# Patient Record
Sex: Male | Born: 1970 | Race: Black or African American | Hispanic: No | Marital: Single | Smoking: Current every day smoker
Health system: Southern US, Community
[De-identification: ages and names within clinical notes are randomized; demographics above are authoritative.]

## PROBLEM LIST (undated history)

## (undated) DIAGNOSIS — F419 Anxiety disorder, unspecified: Secondary | ICD-10-CM

## (undated) HISTORY — DX: Anxiety disorder, unspecified: F41.9

---

## 1998-06-21 ENCOUNTER — Emergency Department (HOSPITAL_COMMUNITY): Admission: EM | Admit: 1998-06-21 | Discharge: 1998-06-21 | Payer: Self-pay | Admitting: Emergency Medicine

## 2006-03-18 ENCOUNTER — Emergency Department (HOSPITAL_COMMUNITY): Admission: EM | Admit: 2006-03-18 | Discharge: 2006-03-18 | Payer: Self-pay | Admitting: Emergency Medicine

## 2006-03-20 ENCOUNTER — Emergency Department (HOSPITAL_COMMUNITY): Admission: EM | Admit: 2006-03-20 | Discharge: 2006-03-20 | Payer: Self-pay | Admitting: Family Medicine

## 2006-03-30 ENCOUNTER — Emergency Department (HOSPITAL_COMMUNITY): Admission: EM | Admit: 2006-03-30 | Discharge: 2006-03-30 | Payer: Self-pay | Admitting: Emergency Medicine

## 2008-12-28 ENCOUNTER — Emergency Department (HOSPITAL_COMMUNITY): Admission: EM | Admit: 2008-12-28 | Discharge: 2008-12-29 | Payer: Self-pay | Admitting: Emergency Medicine

## 2012-05-18 DIAGNOSIS — Z202 Contact with and (suspected) exposure to infections with a predominantly sexual mode of transmission: Secondary | ICD-10-CM | POA: Insufficient documentation

## 2012-05-18 DIAGNOSIS — R300 Dysuria: Secondary | ICD-10-CM | POA: Insufficient documentation

## 2012-05-18 DIAGNOSIS — F17200 Nicotine dependence, unspecified, uncomplicated: Secondary | ICD-10-CM | POA: Insufficient documentation

## 2012-05-19 ENCOUNTER — Encounter (HOSPITAL_COMMUNITY): Payer: Self-pay | Admitting: Emergency Medicine

## 2012-05-19 ENCOUNTER — Emergency Department (HOSPITAL_COMMUNITY)
Admission: EM | Admit: 2012-05-19 | Discharge: 2012-05-19 | Disposition: A | Discharge status: Home / Self Care | Payer: Self-pay | Attending: Emergency Medicine | Admitting: Emergency Medicine

## 2012-05-19 DIAGNOSIS — R300 Dysuria: Secondary | ICD-10-CM

## 2012-05-19 LAB — URINALYSIS, ROUTINE W REFLEX MICROSCOPIC
Bilirubin Urine: NEGATIVE
Leukocytes, UA: NEGATIVE
Nitrite: NEGATIVE
Specific Gravity, Urine: 1.04 — ABNORMAL HIGH (ref 1.005–1.030)
Urobilinogen, UA: 1 mg/dL (ref 0.0–1.0)
pH: 6 (ref 5.0–8.0)

## 2012-05-19 MED ORDER — LIDOCAINE HCL 1 % IJ SOLN
INTRAMUSCULAR | Status: AC
  Administered 2012-05-19: 2.1 mL
  Filled 2012-05-19: qty 20

## 2012-05-19 MED ORDER — AZITHROMYCIN 250 MG PO TABS
ORAL_TABLET | ORAL | Status: AC
  Administered 2012-05-19: 1000 mg via ORAL
  Filled 2012-05-19: qty 4

## 2012-05-19 MED ORDER — CEFTRIAXONE SODIUM 250 MG IJ SOLR
INTRAMUSCULAR | Status: AC
  Filled 2012-05-19: qty 250

## 2012-05-19 MED ORDER — AZITHROMYCIN 250 MG PO TABS
1000.0000 mg | ORAL_TABLET | Freq: Once | ORAL | Status: AC
  Administered 2012-05-19: 1000 mg via ORAL

## 2012-05-19 MED ORDER — CEFTRIAXONE SODIUM 250 MG IJ SOLR
250.0000 mg | Freq: Once | INTRAMUSCULAR | Status: AC
  Administered 2012-05-19: 250 mg via INTRAMUSCULAR

## 2012-05-19 NOTE — ED Notes (Signed)
Pt also request to be tested for STD

## 2012-05-19 NOTE — Discharge Instructions (Signed)

## 2012-05-19 NOTE — ED Notes (Signed)
NP at bedside.

## 2012-05-19 NOTE — ED Provider Notes (Signed)
Medical screening examination/treatment/procedure(s) were performed by non-physician practitioner and as supervising physician I was immediately available for consultation/collaboration. Devoria Albe, MD, FACEP   Ward Givens, MD 05/19/12 [phone]

## 2012-05-19 NOTE — ED Notes (Signed)
Pt alert, nad, c/o burning with urination, onset this am, resp even unlabored, skin pwd

## 2012-05-19 NOTE — ED Provider Notes (Signed)
History     CSN: [id number]  Arrival date & time 05/18/12  2343   First MD Initiated Contact with Patient 05/19/12 0225      Chief Complaint  Patient presents with  . Dysuria  . Exposure to STD    (Consider location/radiation/quality/duration/timing/severity/associated sxs/prior treatment) HPI Comments: The patient noticed a burning sensation with urination since early this afternoon.  He is working outside in the heat lately.  He denies sexual intercourse with anyone other than his wife he does give a history of an STD, in his early teens, and he, states this feels the same discussed testing, and treatment and he requests both.  He does not report any recent episodes of frequency of urination at night, starting or maintaining his urinary stream is no family history of prostate cancer  Patient is a 41 y.o. male presenting with dysuria and STD exposure. The history is provided by the patient.  Dysuria  This is a new problem. The current episode started 3 to 5 hours ago. The problem occurs every urination. The problem has not changed since onset.The quality of the pain is described as burning. The pain is at a severity of 0/10. The patient is experiencing no pain. There has been no fever. Pertinent negatives include no chills, no nausea, no discharge, no frequency and no urgency.  Exposure to STD Pertinent negatives include no chills, fever or nausea.    History reviewed. No pertinent past medical history.  History reviewed. No pertinent past surgical history.  No family history on file.  History  Substance Use Topics  . Smoking status: Current Everyday Smoker -- 1.0 packs/day    Types: Cigarettes  . Smokeless tobacco: Not on file  . Alcohol Use: No      Review of Systems  Constitutional: Negative for fever and chills.  Gastrointestinal: Negative for nausea.  Genitourinary: Positive for dysuria. Negative for urgency, frequency, discharge, penile swelling, penile pain and  testicular pain.  Musculoskeletal: Negative for back pain.    Allergies  Review of patient's allergies indicates no known allergies.  Home Medications  No current outpatient prescriptions on file.  BP 145/81  Pulse 91  Temp 98 F (36.7 C)  Resp 16  Wt 160 lb (72.576 kg)  SpO2 97%  Physical Exam  Constitutional: He appears well-developed.  HENT:  Head: Normocephalic.  Eyes: Pupils are equal, round, and reactive to light.  Neck: Normal range of motion.  Cardiovascular: Normal rate.   Pulmonary/Chest: Effort normal.  Genitourinary: Penis normal. No penile tenderness.       Patient is circumcised.  There is no irritation at the urinary meatus.  There is no discharge there is no tenderness to shaft of the penis normal testicles, there is no rash or lesions  Musculoskeletal: Normal range of motion.  Neurological: He is alert.  Skin: Skin is warm.    ED Course  Procedures (including critical care time)  Labs Reviewed  URINALYSIS, ROUTINE W REFLEX MICROSCOPIC - Abnormal; Notable for the following:    Color, Urine AMBER (*) BIOCHEMICALS MAY BE AFFECTED BY COLOR   APPearance CLOUDY (*)    Specific Gravity, Urine 1.040 (*)    Ketones, ur TRACE (*)    All other components within normal limits  GC/CHLAMYDIA PROBE AMP, GENITAL   No results found.   1. Dysuria       MDM   Patient's exam is normal, but per his request.  He has been treated for possible STD cultures have been  sent        Arman Filter, NP 05/19/12 [id number]

## 2012-05-21 LAB — GC/CHLAMYDIA PROBE AMP, GENITAL
Chlamydia, DNA Probe: NEGATIVE
GC Probe Amp, Genital: NEGATIVE

## 2012-10-16 ENCOUNTER — Emergency Department (HOSPITAL_COMMUNITY)
Admission: EM | Admit: 2012-10-16 | Discharge: 2012-10-16 | Disposition: A | Discharge status: Home / Self Care | Payer: Self-pay | Attending: Emergency Medicine | Admitting: Emergency Medicine

## 2012-10-16 ENCOUNTER — Encounter (HOSPITAL_COMMUNITY): Payer: Self-pay | Admitting: *Deleted

## 2012-10-16 DIAGNOSIS — Y9389 Activity, other specified: Secondary | ICD-10-CM | POA: Insufficient documentation

## 2012-10-16 DIAGNOSIS — IMO0002 Reserved for concepts with insufficient information to code with codable children: Secondary | ICD-10-CM

## 2012-10-16 DIAGNOSIS — S335XXA Sprain of ligaments of lumbar spine, initial encounter: Secondary | ICD-10-CM | POA: Insufficient documentation

## 2012-10-16 DIAGNOSIS — Y9269 Other specified industrial and construction area as the place of occurrence of the external cause: Secondary | ICD-10-CM | POA: Insufficient documentation

## 2012-10-16 DIAGNOSIS — F17200 Nicotine dependence, unspecified, uncomplicated: Secondary | ICD-10-CM | POA: Insufficient documentation

## 2012-10-16 DIAGNOSIS — Y990 Civilian activity done for income or pay: Secondary | ICD-10-CM | POA: Insufficient documentation

## 2012-10-16 DIAGNOSIS — X500XXA Overexertion from strenuous movement or load, initial encounter: Secondary | ICD-10-CM | POA: Insufficient documentation

## 2012-10-16 MED ORDER — IBUPROFEN 600 MG PO TABS: 600.0000 mg | ORAL_TABLET | Freq: Four times a day (QID) | ORAL | Status: DC | PRN

## 2012-10-16 MED ORDER — KETOROLAC TROMETHAMINE 60 MG/2ML IM SOLN
60.0000 mg | Freq: Once | INTRAMUSCULAR | Status: AC
  Administered 2012-10-16: 60 mg via INTRAMUSCULAR
  Filled 2012-10-16: qty 2

## 2012-10-16 MED ORDER — HYDROCODONE-ACETAMINOPHEN 5-500 MG PO TABS: 1.0000 | ORAL_TABLET | Freq: Four times a day (QID) | ORAL | Status: DC | PRN

## 2012-10-16 MED ORDER — CYCLOBENZAPRINE HCL 10 MG PO TABS: 10.0000 mg | ORAL_TABLET | Freq: Two times a day (BID) | ORAL | Status: DC | PRN

## 2012-10-16 MED ORDER — OXYCODONE-ACETAMINOPHEN 5-325 MG PO TABS
1.0000 | ORAL_TABLET | Freq: Once | ORAL | Status: AC
  Administered 2012-10-16: 1 via ORAL
  Filled 2012-10-16: qty 1

## 2012-10-16 NOTE — ED Provider Notes (Signed)
History     CSN: [id number]  Arrival date & time 10/16/12  1152   First MD Initiated Contact with Patient 10/16/12 1312      No chief complaint on file.   (Consider location/radiation/quality/duration/timing/severity/associated sxs/prior treatment) HPI Comments: This is a 41 year old male, who presents to the emergency department with chief complaint of low back pain. The patient states that he was at work last night, and lifted a chair, when he felt a sharp pain in his back. The pain does not radiate to his legs. Denies decreased sensation. He has not taken anything for his pain.  The history is provided by the patient. No language interpreter was used.    History reviewed. No pertinent past medical history.  History reviewed. No pertinent past surgical history.  History reviewed. No pertinent family history.  History  Substance Use Topics  . Smoking status: Current Every Day Smoker -- 1.0 packs/day    Types: Cigarettes  . Smokeless tobacco: Never Used  . Alcohol Use: No      Review of Systems  Musculoskeletal: Positive for back pain.       Lumbar back pain  All other systems reviewed and are negative.    Allergies  Review of patient's allergies indicates no known allergies.  Home Medications  No current outpatient prescriptions on file.  BP 126/73  Pulse 77  Temp 99.5 F (37.5 C) (Oral)  Resp 18  SpO2 99%  Physical Exam  Nursing note and vitals reviewed. Constitutional: He is oriented to person, place, and time. He appears well-developed and well-nourished.  HENT:  Head: Normocephalic and atraumatic.  Eyes: Conjunctivae normal and EOM are normal. Pupils are equal, round, and reactive to light. Right eye exhibits no discharge. Left eye exhibits no discharge. No scleral icterus.  Neck: Normal range of motion. Neck supple.  Cardiovascular: Normal rate, regular rhythm and normal heart sounds.  Exam reveals no gallop and no friction rub.   No murmur  heard. Pulmonary/Chest: Breath sounds normal. No respiratory distress. He has no wheezes. He has no rales. He exhibits no tenderness.  Abdominal: Soft. Bowel sounds are normal.  Musculoskeletal: Normal range of motion.       Tenderness to palpation in lumbar paraspinals, no bony tenderness, or vertebral disc space tenderness, sensation and strength intact bilaterally  Neurological: He is alert and oriented to person, place, and time.  Skin: Skin is warm and dry.  Psychiatric: He has a normal mood and affect. His behavior is normal. Judgment and thought content normal.    ED Course  Procedures (including critical care time)  Labs Reviewed - No data to display No results found.   1. Back sprain or strain       MDM  41 year old male with back sprain/strain. Will manage the patient's pain while in the ED.  Patient is neurovascularly intact. I will have the patient follow up with PCP. They are stable and ready for discharge.   2:30 PM Patient reports that his pain is significantly better, he is able to stand and walk with mild discomfort. I'm going to discharge the patient to home with primary care follow up.  Treatments while in ED: 1. Vicodin 5-325 one tab 2. Toradol 60 mg IM  Discharged with: 1. Vicodin  2. Ibuprofen 3 Flexeril        Roxy Horseman, PA-C 10/16/12 1431

## 2012-10-16 NOTE — ED Notes (Signed)
Pt states yesterday was cleaning chairs and developed 2 sharp pains in his lower back and states "I went to the floor". Denies picking chairs up, states he was just cleaning chairs.

## 2012-10-17 NOTE — ED Provider Notes (Signed)
Medical screening examination/treatment/procedure(s) were performed by non-physician practitioner and as supervising physician I was immediately available for consultation/collaboration.   Benny Lennert, MD 10/17/12 [phone]

## 2015-01-01 ENCOUNTER — Emergency Department (HOSPITAL_COMMUNITY)
Admission: EM | Admit: 2015-01-01 | Discharge: 2015-01-01 | Disposition: A | Discharge status: Home / Self Care | Payer: Self-pay | Attending: Emergency Medicine | Admitting: Emergency Medicine

## 2015-01-01 ENCOUNTER — Emergency Department (HOSPITAL_COMMUNITY): Payer: Self-pay

## 2015-01-01 ENCOUNTER — Encounter (HOSPITAL_COMMUNITY): Payer: Self-pay | Admitting: *Deleted

## 2015-01-01 DIAGNOSIS — R0789 Other chest pain: Secondary | ICD-10-CM | POA: Insufficient documentation

## 2015-01-01 DIAGNOSIS — F17200 Nicotine dependence, unspecified, uncomplicated: Secondary | ICD-10-CM

## 2015-01-01 DIAGNOSIS — M79622 Pain in left upper arm: Secondary | ICD-10-CM | POA: Insufficient documentation

## 2015-01-01 DIAGNOSIS — Z720 Tobacco use: Secondary | ICD-10-CM | POA: Insufficient documentation

## 2015-01-01 DIAGNOSIS — R079 Chest pain, unspecified: Secondary | ICD-10-CM

## 2015-01-01 LAB — I-STAT TROPONIN, ED
TROPONIN I, POC: 0 ng/mL (ref 0.00–0.08)
Troponin i, poc: 0 ng/mL (ref 0.00–0.08)

## 2015-01-01 LAB — CBC
HEMATOCRIT: 45.3 % (ref 39.0–52.0)
Hemoglobin: 15.5 g/dL (ref 13.0–17.0)
MCH: 29.1 pg (ref 26.0–34.0)
MCHC: 34.2 g/dL (ref 30.0–36.0)
MCV: 85 fL (ref 78.0–100.0)
PLATELETS: 220 10*3/uL (ref 150–400)
RBC: 5.33 MIL/uL (ref 4.22–5.81)
RDW: 13.8 % (ref 11.5–15.5)
WBC: 6.2 10*3/uL (ref 4.0–10.5)

## 2015-01-01 LAB — BASIC METABOLIC PANEL
Anion gap: 5 (ref 5–15)
BUN: 15 mg/dL (ref 6–23)
CO2: 31 mmol/L (ref 19–32)
Calcium: 9.5 mg/dL (ref 8.4–10.5)
Chloride: 104 mEq/L (ref 96–112)
Creatinine, Ser: 1.11 mg/dL (ref 0.50–1.35)
GFR calc Af Amer: >90 mL/min (ref >90)
GFR calc non Af Amer: 80 mL/min — ABNORMAL LOW (ref >90)
Glucose, Bld: 95 mg/dL (ref 70–99)
POTASSIUM: 3.9 mmol/L (ref 3.5–5.1)
Sodium: 140 mmol/L (ref 135–145)

## 2015-01-01 MED ORDER — AMOXICILLIN 500 MG PO CAPS
500.0000 mg | ORAL_CAPSULE | Freq: Once | ORAL | Status: AC
  Administered 2015-01-01: 500 mg via ORAL
  Filled 2015-01-01: qty 1

## 2015-01-01 MED ORDER — AMOXICILLIN 500 MG PO CAPS: 500.0000 mg | ORAL_CAPSULE | Freq: Three times a day (TID) | ORAL | Status: DC

## 2015-01-01 NOTE — ED Provider Notes (Signed)
CSN: [id number]     Arrival date & time 01/01/15  1627 History   First MD Initiated Contact with Patient 01/01/15 2055     Chief Complaint  Patient presents with  . Chest Pain     (Consider location/radiation/quality/duration/timing/severity/associated sxs/prior Treatment) HPI     Bryan Rivas is a 44 y.o. male c/o sharp, focal, left axillary pain rated at 2 out of 3 intermittently for the last 4 days. Patient states it lasts approximately 5 minutes, this comes on approximately 2 times per day. No pain medication taken prior to arrival.  Pain has been non-exertional, non-pleuritic or positional, not exacerbated by palpation. Denies SOB, N/V, diaphoresis, cough, fever, back pain, syncope, prior episodes, recent cocaine/methamphetimine use. Denies h/o DVT, PE,  recent travel, leg swelling, hemoptysis.  Pt has not received any ASA or NTG in the last 24 hours. Patient states he has a tooth that is status post root canal and tastes a foul taste in his mouth for the last several days he denies pain, swelling, discharge from the tooth.  RF: Tobacco use 20 pack year, father MI @ 40 Last Stress test: ? Cardiologost:? PCP:?   History reviewed. No pertinent past medical history. History reviewed. No pertinent past surgical history. History reviewed. No pertinent family history. History  Substance Use Topics  . Smoking status: Current Every Day Smoker -- 1.00 packs/day    Types: Cigarettes  . Smokeless tobacco: Never Used  . Alcohol Use: No    Review of Systems  10 systems reviewed and found to be negative, except as noted in the HPI.   Allergies  Review of patient's allergies indicates no known allergies.  Home Medications   Prior to Admission medications   Medication Sig Start Date End Date Taking? Authorizing Provider  amoxicillin (AMOXIL) 500 MG capsule Take 1 capsule (500 mg total) by mouth 3 (three) times daily. 01/01/15   Bryan Seres, PA-C  cyclobenzaprine (FLEXERIL)  10 MG tablet Take 1 tablet (10 mg total) by mouth 2 (two) times daily as needed for muscle spasms. 10/16/12   Bryan Horseman, PA-C  HYDROcodone-acetaminophen (VICODIN) 5-500 MG per tablet Take 1-2 tablets by mouth every 6 (six) hours as needed for pain. 10/16/12   Bryan Horseman, PA-C  ibuprofen (ADVIL,MOTRIN) 600 MG tablet Take 1 tablet (600 mg total) by mouth every 6 (six) hours as needed for pain. 10/16/12   Bryan Horseman, PA-C   BP 129/81 mmHg  Pulse 52  Temp(Src) 97.5 F (36.4 C) (Oral)  Resp 14  SpO2 100% Physical Exam  Constitutional: He is oriented to person, place, and time. He appears well-developed and well-nourished. No distress.  HENT:  Head: Normocephalic and atraumatic.  Mouth/Throat: Oropharynx is clear and moist.  Generally good dentition, no gingival swelling, erythema or tenderness to palpation. Patient is handling their secretions. There is no tenderness to palpation or firmness underneath tongue bilaterally. No trismus.    Eyes: Conjunctivae and EOM are normal. Pupils are equal, round, and reactive to light.  Neck: Normal range of motion. Neck supple.  Cardiovascular: Normal rate, regular rhythm and intact distal pulses.   Pulmonary/Chest: Effort normal and breath sounds normal. No stridor. No respiratory distress. He has no wheezes. He has no rales. He exhibits no tenderness.  Abdominal: Soft. Bowel sounds are normal. He exhibits no distension and no mass. There is no tenderness. There is no rebound and no guarding.  Musculoskeletal: Normal range of motion. He exhibits no edema or tenderness.  No calf asymmetry,  superficial collaterals, palpable cords, edema, Homans sign negative bilaterally.    Neurological: He is alert and oriented to person, place, and time.  Skin: Skin is warm.  Psychiatric: He has a normal mood and affect.  Nursing note and vitals reviewed.   ED Course  Procedures (including critical care time) Labs Review Labs Reviewed  BASIC  METABOLIC PANEL - Abnormal; Notable for the following:    GFR calc non Af Amer 80 (*)    All other components within normal limits  CBC  I-STAT TROPOININ, ED  Rosezena SensorI-STAT TROPOININ, ED    Imaging Review Dg Chest 2 View  01/01/2015   CLINICAL DATA:  Left-sided chest pain.  Pain for 2 days.  EXAM: CHEST  2 VIEW  COMPARISON:  None.  FINDINGS: Increased perihilar markings, borderline hyperinflation. The cardiomediastinal contours are normal. Pulmonary vasculature is normal. No consolidation, pleural effusion, or pneumothorax. No acute osseous abnormalities are seen.  IMPRESSION: Increased perihilar markings, suggests bronchitis or asthma.   Electronically Signed   By: Bryan OaksMelanie  Rivas M.D.   On: 01/01/2015 18:18     EKG Interpretation   Date/Time:  Thursday January 01 2015 16:32:51 EST Ventricular Rate:  79 PR Interval:  138 QRS Duration: 82 QT Interval:  348 QTC Calculation: 399 R Axis:   79 Text Interpretation:  Normal sinus rhythm with sinus arrhythmia Minimal  voltage criteria for LVH, may be normal variant Borderline ECG No old  tracing to compare Confirmed by Bryan Rivas, Bryan Rivas [phone]([id number]) on 01/01/2015  10:14:02 PM      MDM   Final diagnoses:  Chest pain, atypical  Tobacco use disorder    Filed Vitals:   01/01/15 2200 01/01/15 2215 01/01/15 2230 01/01/15 2244  BP:    129/81  Pulse:      Temp:      TempSrc:      Resp:      SpO2: 95% 99% 100%    Bryan Rivas is a pleasant 44 y.o. male presenting with focal intermittent left axillary pain onset 4 days ago. EKG is nonischemic, no arrhythmia. Blood work with no abnormality, initial and delta troponin are negative. Patient is low risk (1 for RF) by heart score. This is very atypical, I doubt this is cardiac in nature. Discussed case with attending who agrees with care plan and stability to discharge to home.  Patient states he has a foul taste in his mouth and thinks it is coming from his tooth that has remote root canal. No  abnormalities on my physical exam. Will start him on amoxicillin.   Evaluation does not show pathology that would require ongoing emergent intervention or inpatient treatment. Pt is hemodynamically stable and mentating appropriately. Discussed findings and plan with patient/guardian, who agrees with care plan. All questions answered. Return precautions discussed and outpatient follow up given.   Discharge Medication List as of 01/01/2015 10:32 PM    START taking these medications   Details  amoxicillin (AMOXIL) 500 MG capsule Take 1 capsule (500 mg total) by mouth 3 (three) times daily., Starting 01/01/2015, Until Discontinued, [employer]             Jajaira Ruis, PA-C 01/02/15 [id number]  Bryan MoMatthew Gentry, MD 01/02/15 [phone]

## 2015-01-01 NOTE — ED Notes (Signed)
Pt in c/o intermittent left sided chest pain, denies other symptoms, states pain does not last long when it hits, no distress noted, denies pain at this time.

## 2015-01-01 NOTE — Discharge Instructions (Signed)
Do not hesitate to return to the emergency room for any new, worsening or concerning symptoms.  Please obtain primary care using resource guide below. But the minute you were seen in the emergency room and that they will need to obtain records for further outpatient management.  Take your antibiotics as directed and to the end of the course.  Followup with a dentist is very important for ongoing evaluation and management of recurrent dental pain. Return to emergency department for emergent changing or worsening symptoms."  Low-cost dental clinic: Yancey Flemings  at [phone]**  **Nuala Alpha at [phone] [address]**    You may also call [phone]  Dental Assistance If the dentist on-call cannot see you, please use the resources below:   Patients with Medicaid: [facility] [phone] [address], [phone] [address] [phone]  If unable to pay, or uninsured, contact [facility] [phone]) or [facility] Department [phone] in [place], [phone] in [place]) to become qualified for the adult dental clinic  Other Low-Cost Community Dental Services: [facility] [address] Natasha Bence [place], [place], [address]    [phone], Ext. [phone]    2nd and 4th Thursday of the month at 6:30am    10 clients each day by appointment, can sometimes see walk-in     patients if someone does not show for an appointment [facility]- [address] Ether Griffins [place], [place], [address]    [phone] [facility] [address] [place], [place], [address]    [phone]  [facility] Health Department- [phone] [facility] Health Department- [phone] [facility] [phone]    Chest Pain (Nonspecific) It is often hard to give a specific diagnosis for the cause of chest pain. There is always a chance that your pain could be related to something serious, such as a heart attack or a blood  clot in the lungs. You need to follow up with your health care provider for further evaluation. CAUSES   Heartburn.  Pneumonia or bronchitis.  Anxiety or stress.  Inflammation around your heart (pericarditis) or lung (pleuritis or pleurisy).  A blood clot in the lung.  A collapsed lung (pneumothorax). It can develop suddenly on its own (spontaneous pneumothorax) or from trauma to the chest.  Shingles infection (herpes zoster virus). The chest wall is composed of bones, muscles, and cartilage. Any of these can be the source of the pain.  The bones can be bruised by injury.  The muscles or cartilage can be strained by coughing or overwork.  The cartilage can be affected by inflammation and become sore (costochondritis). DIAGNOSIS  Lab tests or other studies may be needed to find the cause of your pain. Your health care provider may have you take a test called an ambulatory electrocardiogram (ECG). An ECG records your heartbeat patterns over a 24-hour period. You may also have other tests, such as:  Transthoracic echocardiogram (TTE). During echocardiography, sound waves are used to evaluate how blood flows through your heart.  Transesophageal echocardiogram (TEE).  Cardiac monitoring. This allows your health care provider to monitor your heart rate and rhythm in real time.  Holter monitor. This is a portable device that records your heartbeat and can help diagnose heart arrhythmias. It allows your health care provider to track your heart activity for several days, if needed.  Stress tests by exercise or by giving medicine that makes the heart beat faster. TREATMENT   Treatment depends on what  may be causing your chest pain. Treatment may include:  Acid blockers for heartburn.  Anti-inflammatory medicine.  Pain medicine for inflammatory conditions.  Antibiotics if an infection is present.  You may be advised to change lifestyle habits. This includes stopping smoking and  avoiding alcohol, caffeine, and chocolate.  You may be advised to keep your head raised (elevated) when sleeping. This reduces the chance of acid going backward from your stomach into your esophagus. Most of the time, nonspecific chest pain will improve within 2-3 days with rest and mild pain medicine.  HOME CARE INSTRUCTIONS   If antibiotics were prescribed, take them as directed. Finish them even if you start to feel better.  For the next few days, avoid physical activities that bring on chest pain. Continue physical activities as directed.  Do not use any tobacco products, including cigarettes, chewing tobacco, or electronic cigarettes.  Avoid drinking alcohol.  Only take medicine as directed by your health care provider.  Follow your health care provider's suggestions for further testing if your chest pain does not go away.  Keep any follow-up appointments you made. If you do not go to an appointment, you could develop lasting (chronic) problems with pain. If there is any problem keeping an appointment, call to reschedule. SEEK MEDICAL CARE IF:   Your chest pain does not go away, even after treatment.  You have a rash with blisters on your chest.  You have a fever. SEEK IMMEDIATE MEDICAL CARE IF:   You have increased chest pain or pain that spreads to your arm, neck, jaw, back, or abdomen.  You have shortness of breath.  You have an increasing cough, or you cough up blood.  You have severe back or abdominal pain.  You feel nauseous or vomit.  You have severe weakness.  You faint.  You have chills. This is an emergency. Do not wait to see if the pain will go away. Get medical help at once. Call your local emergency services (911 in U.S.). Do not drive yourself to the hospital. MAKE SURE YOU:   Understand these instructions.  Will watch your condition.  Will get help right away if you are not doing well or get worse. Document Released: 09/14/2005 Document Revised:  12/10/2013 Document Reviewed: 07/10/2008 [facility] Patient Information 2015 [place], [place]. This information is not intended to replace advice given to you by your health care provider. Make sure you discuss any questions you have with your health care provider.     Emergency Department Resource Guide 1) Find a Doctor and Pay Out of Pocket Although you won't have to find out who is covered by your insurance plan, it is a good idea to ask around and get recommendations. You will then need to call the office and see if the doctor you have chosen will accept you as a new patient and what types of options they offer for patients who are self-pay. Some doctors offer discounts or will set up payment plans for their patients who do not have insurance, but you will need to ask so you aren't surprised when you get to your appointment.  2) Contact Your Local Health Department Not all health departments have doctors that can see patients for sick visits, but many do, so it is worth a call to see if yours does. If you don't know where your local health department is, you can check in your phone book. The CDC also has a tool to help you locate your state's health department,  and many state websites also have listings of all of their local health departments.  3) Find a Walk-in Clinic If your illness is not likely to be very severe or complicated, you may want to try a walk in clinic. These are popping up all over the country in pharmacies, drugstores, and shopping centers. They're usually staffed by nurse practitioners or physician assistants that have been trained to treat common illnesses and complaints. They're usually fairly quick and inexpensive. However, if you have serious medical issues or chronic medical problems, these are probably not your best option.  No Primary Care Doctor: - Call [facility] Connect at  [phone] - they can help you locate a primary care doctor that  accepts your insurance, provides  certain services, etc. - Physician Referral Service- [phone]  Chronic Pain Problems: Organization         Address  Phone   Notes  [facility]  [phone] Patients need to be referred by their primary care doctor.   Medication Assistance: Organization         Address  Phone   Notes  [facility] [address] [place] [place], [place] [address] [phone] --Must be a resident of [facility] -- Must have NO insurance coverage whatsoever (no Medicaid/ Medicare, etc.) -- The pt. MUST have a primary care doctor that directs their care regularly and follows them in the community   [facility]  [phone]   [employer]  [phone]    Agencies that provide inexpensive medical care: Organization         Address  Phone   Notes  [facility]  [phone]   [facility]    [phone]   [facility] [address] [place], [place] [address] [phone]   [facility] [place] [address] [place] [place] [phone]   Planned Parenthood    [phone]   [facility]    [phone]   [facility]  [address], [place] Phone:  [phone], Fax:  [phone] Hours of Operation:  9 am - 6 pm, M-F.  Also accepts Medicaid/Medicare and self-pay.  [facility]  [address], [place] Phone: [phone], Fax: [phone]. Hours of Operation:  8:30 am - 5:30 pm, M-F.  Also accepts Medicaid and self-pay.  [facility] [place] [address] [place] Phone: [phone]   [facility] [address] Natasha Bence [place], [place] [phone], Ext. [phone] Mondays & Thursdays: 7-9 AM.  First 15 patients are seen on a first come, first serve basis.    Medicaid-accepting [facility] Providers:  Organization         Address  Phone   Notes  [facility] [address], [place] [phone] Also accepts self-pay patients.  [facility] [address] Laurell Josephs [place], [place]  [phone]   [facility] [address], [place] [phone]   [facility] [address], [place] [phone]   Renaye Rakers [address], [place]   [phone] Only accepts [place] Access [place] patients after they have their name applied to their card.   Self-Pay (no insurance) in [facility]:  Organization  Address  Phone   Notes  Sickle Cell Patients, [facility] [address] [place], [place] [phone]   [facility] [address] [place], [place] [phone]   [facility] [place]  [address], [place] [phone]   [facility]/Dr. Osei-Bonsu  [address], [place] or [address], [place] [phone] Phone number for both [place] and [place] locations is the same.  Urgent Medical and [facility] [address], [place] [phone]   [facility] [address] [place] or [address] [phone]   [facility] [address], [place] [phone], phone; [phone], fax Sees patients 1st and 3rd Saturday of every month.  Must not qualify for public or private insurance (i.e. Medicaid, Medicare, [place] Health Choice, Veterans' Benefits)  Household income should be no more than 200% of the poverty level The clinic cannot treat you if you are pregnant or think you are pregnant  Sexually transmitted diseases are not treated at the clinic.    Dental Care: Organization         Address  Phone  Notes  [facility] [address] [place], [place] [phone]  Accepts children up to age 25 who are enrolled in [place] or [place] Health Choice; pregnant women with a Medicaid card; and children who have applied for Medicaid or [place] Health Choice, but were declined, whose parents can pay a reduced fee at time of service.  [facility] of [facility]  [address], [place] [phone] Accepts children up to age 20 who are enrolled in [place] or [place] Health Choice; pregnant women with a Medicaid card; and children who have applied for Medicaid or [place] Health Choice, but were declined, whose parents can pay a reduced fee at time of service.  [facility]  [address] [place], [place] [phone] Patients are seen by appointment only. Walk-ins are not accepted. [facility] will see patients 56 years of age and older. Monday - Tuesday (8am-5pm) Most Wednesdays (8:30-5pm) $30 per visit, cash only  [facility]  [address], [facility] [phone] Patients are seen by appointment only. Walk-ins are not accepted. [facility] will see patients 59 years of age and older. One Wednesday Evening (Monthly: Volunteer Based).  $30 per visit, cash only  [employer]  [phone] for adults; Children under age 40, call Graduate Pediatric Dentistry at [phone]. Children aged 4-14, please call [phone] to request a pediatric application.  Dental services are provided in all areas of dental care including fillings, crowns and bridges, complete and partial dentures, implants, gum treatment, root canals, and extractions. Preventive care is also provided. Treatment is provided to both adults and children. Patients are selected via a lottery and there is often a waiting list.   [facility] [address] [place]  [phone] [email or web address]   [facility] [address] [place], [place] [phone], Ext. [phone] Second  and Fourth Thursday of each month, opens at 6:30 AM; Clinic ends at 9 AM.  Patients are seen on a first-come first-served basis, and a limited number are seen during each clinic.   [facility]  [address] [place], [place]  [place], [place] [phone]   Eligibility Requirements You must have lived in [place], [place], or [place] counties for at least the last three months.   You cannot be eligible for state or federal sponsored [place], including [employer], [place], or [employer].   You generally cannot be eligible for healthcare insurance through your employer.    How to apply: Eligibility screenings are held every Tuesday and Wednesday afternoon from 1:00 pm until 4:00 pm. You do not need an appointment for the interview!  [facility] [address] [place], [place] [phone]   [facility] Health Department  [phone]   [facility] Health Department  [phone]   [facility] Health Department  [phone]    Behavioral Health Resources in the Community: Intensive Outpatient Programs Organization         Address  Phone  Notes  [facility] [address] [place], [place] [phone]   [facility] [address] [place], [place] [phone]   [facility] [address] [place], [place]  [phone]   [facility] Mental Health [address],  [place], [place] [phone] or [phone]   Substance Abuse Resources Organization         Address  Phone  Notes  [facility]  [phone]   [facility]  [phone]   [facility] [place]  [phone]   Floydene Flock  [phone]   Residential & Outpatient Substance Abuse Program  [phone]   Psychological Services Organization         Address  Phone  Notes  [facility] Behavioral Health  [phone]   [facility] Services  [phone]   [facility] Mental  Health [address], [place] [phone] or [phone]    Mobile Crisis Teams Organization         Address  Phone  Notes  [facility], Mobile Crisis Care Unit  [phone]   [facility]  [address] [place], [place] [phone]   Doristine Locks [address] [place] [phone]    Self-Help/Support Groups Organization         Address  Phone             Notes  [facility] [place] - variety of support groups  [phone]- [phone] Call for more information  Narcotics Anonymous (NA), [facility] [address], [employer] [place]  2 meetings at this location   Statistician         Address  Phone  Notes  [facility] [address],    [place]  [phone]   [facility]  [address] [place] [address], [place], [place] [phone]   [facility] [address] [place], [place] [phone] Admissions: 8am-3pm M-F  [facility] [address],    [place], [place] [phone]   [facility] [address] Starling Manns [place], [place] [phone]   [facility] [address],  [place], [place] [phone]   [facility] - Intensive Outpatient [address] Laurell Josephs [address], [place], [place] [phone]   [facility] (Addiction Recovery [facility] [address] [place].,  [place], [place] [phone] or [phone]   [facility] ([facility]) [address] [place], [place] [phone] Accepts Medicaid  [facility] [place] [address],  [place] [phone] Substance Abuse/Addiction Treatment   [facility] Resources Organization  Address  Phone  Notes  [facility]  [phone]) [phone]   Angie FavaJulie Brannon, PhD [address] [place], [place]   [phone]) [phone] or [phone]) [phone]   [facility]   [address] [place], [place]  [phone]) [phone]   [facility] [address], [place], [place] [phone]) [phone] Insurance/Medicaid/sponsorship through [facility]  [facility] [address]                                    [place], [place] [phone] Therapy/tele-psych/case  [facility] [address]   [place], [place] [phone](336) [phone]    Dr. Lolly MustacheArfeen  [phone](336) [phone]   [facility] [place]  [employer] [facility] 1) [address],  2) [address], [place] 3)  [address] [place] [address], [place] [phone]) [phone]) [phone]  [phone]) [phone]   [facility] Child Abuse Hotline [phone](336) [phone] or [phone](336) [phone] (After Hours)

## 2015-12-12 IMAGING — CR DG CHEST 2V
2 series · 2 of 2 positions shown · non-contrast
Comparison: None.

CLINICAL DATA: Left-sided chest pain.  Pain for 2 days.

EXAM:
CHEST  2 VIEW

[chest pa]
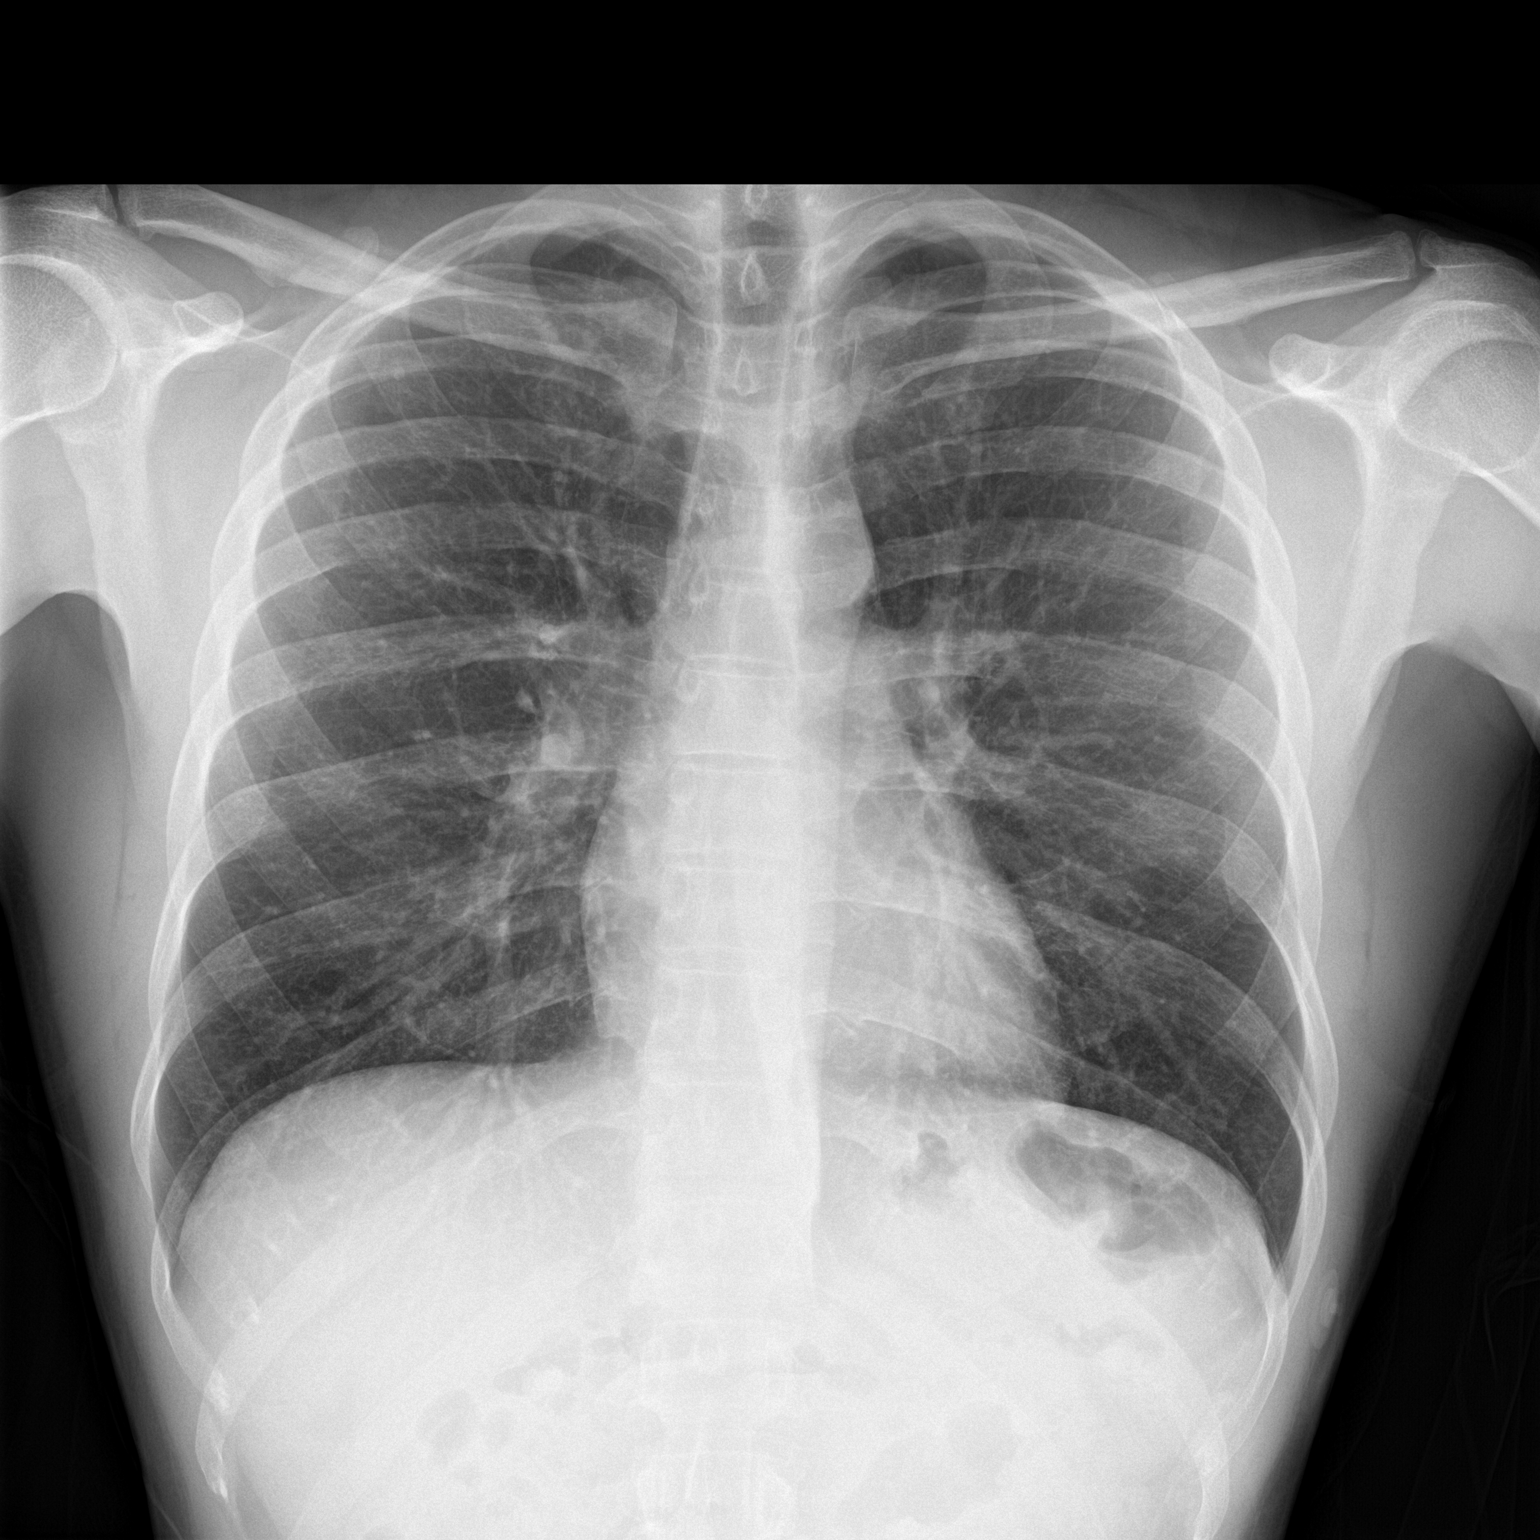

[chest lat]
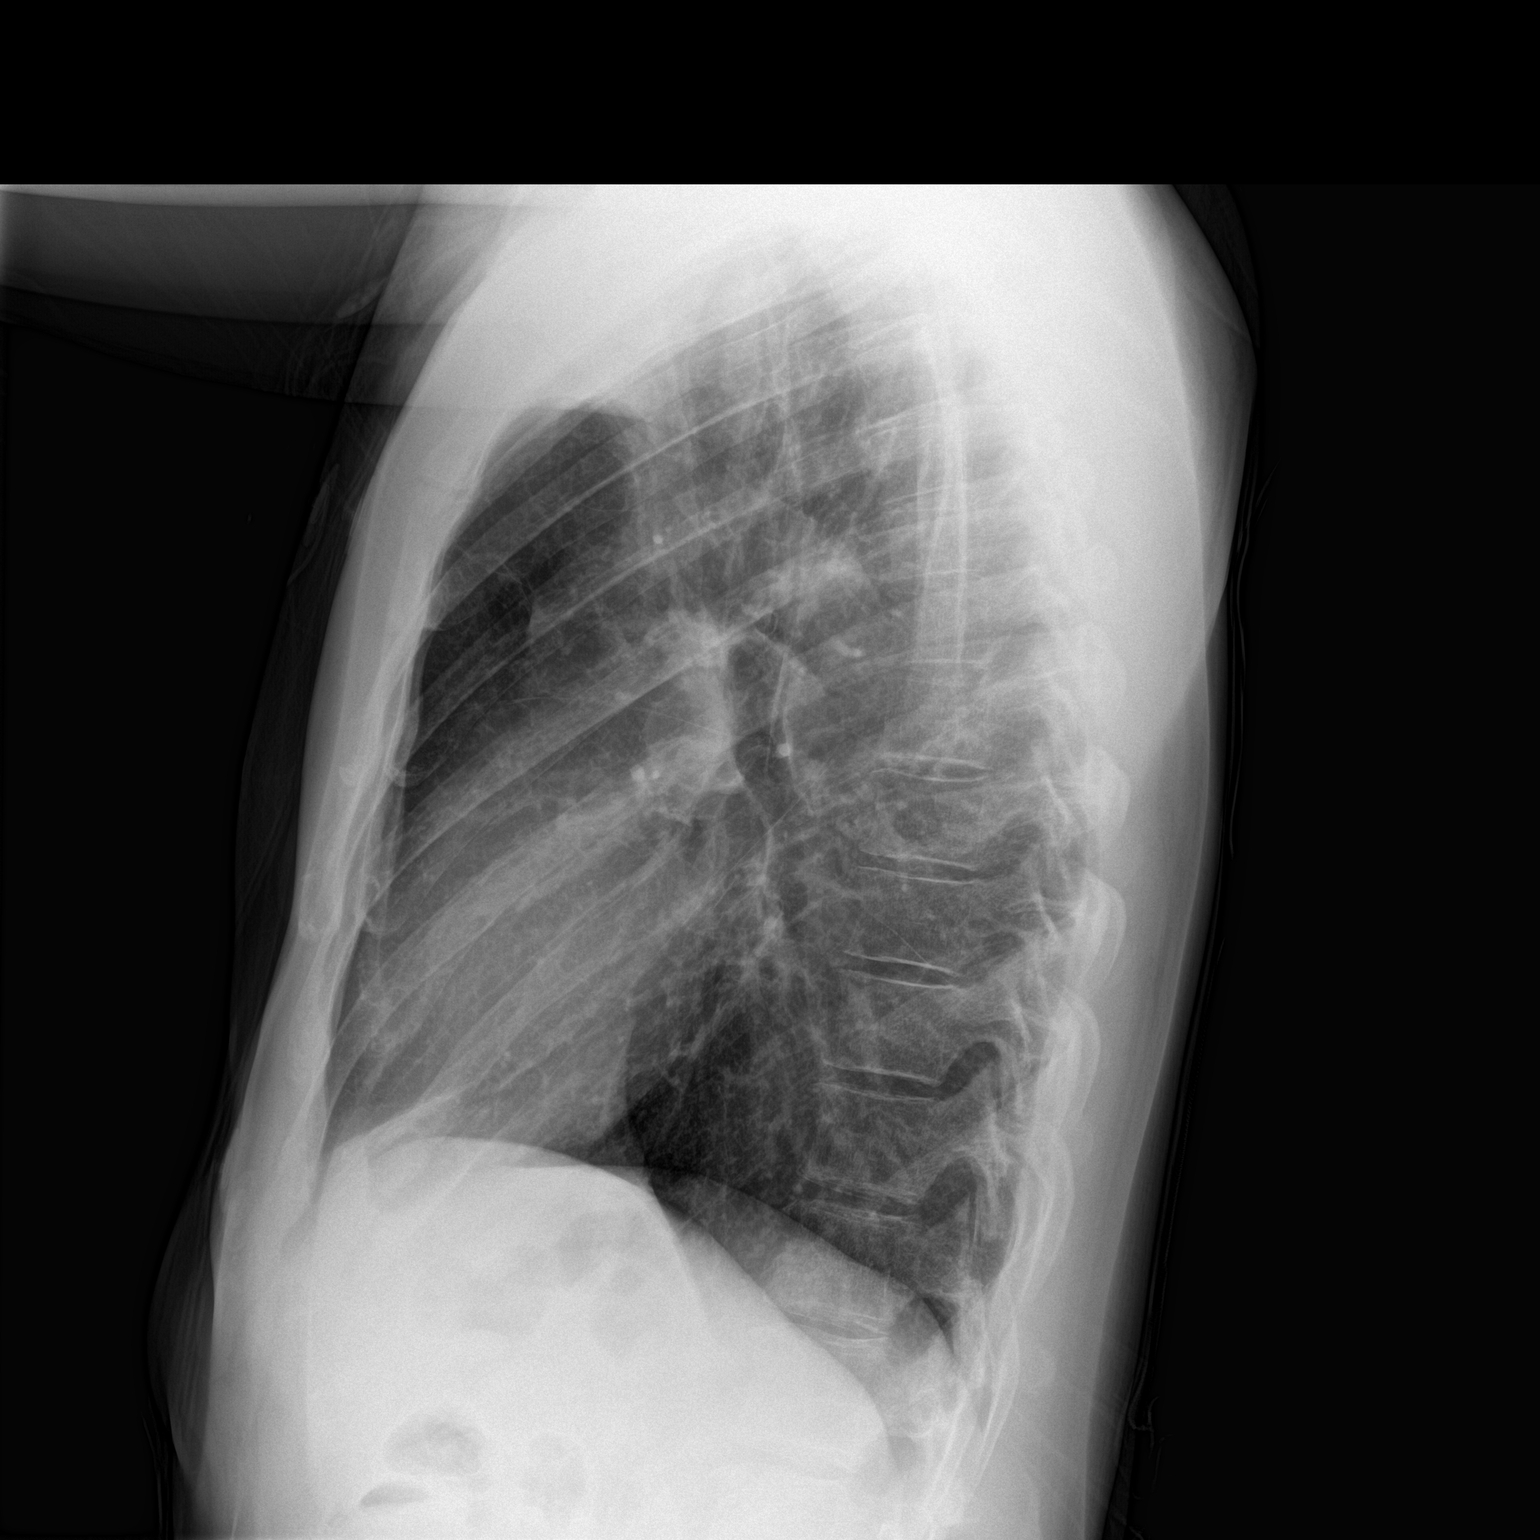

[2 of 2 positions shown; findings below may reference images not displayed]

FINDINGS: Increased perihilar markings, borderline hyperinflation. The
cardiomediastinal contours are normal. Pulmonary vasculature is
normal. No consolidation, pleural effusion, or pneumothorax. No
acute osseous abnormalities are seen.
IMPRESSION: Increased perihilar markings, suggests bronchitis or asthma.

## 2015-12-16 ENCOUNTER — Emergency Department (HOSPITAL_COMMUNITY)
Admission: EM | Admit: 2015-12-16 | Discharge: 2015-12-16 | Disposition: A | Discharge status: Home / Self Care | Payer: Managed Care, Other (non HMO) | Attending: Emergency Medicine | Admitting: Emergency Medicine

## 2015-12-16 ENCOUNTER — Encounter (HOSPITAL_COMMUNITY): Payer: Self-pay | Admitting: Emergency Medicine

## 2015-12-16 DIAGNOSIS — R0981 Nasal congestion: Secondary | ICD-10-CM | POA: Diagnosis present

## 2015-12-16 DIAGNOSIS — H938X9 Other specified disorders of ear, unspecified ear: Secondary | ICD-10-CM | POA: Diagnosis not present

## 2015-12-16 DIAGNOSIS — B349 Viral infection, unspecified: Secondary | ICD-10-CM | POA: Diagnosis not present

## 2015-12-16 DIAGNOSIS — F17210 Nicotine dependence, cigarettes, uncomplicated: Secondary | ICD-10-CM | POA: Insufficient documentation

## 2015-12-16 DIAGNOSIS — Z792 Long term (current) use of antibiotics: Secondary | ICD-10-CM | POA: Diagnosis not present

## 2015-12-16 DIAGNOSIS — J069 Acute upper respiratory infection, unspecified: Secondary | ICD-10-CM | POA: Diagnosis not present

## 2015-12-16 MED ORDER — FLUTICASONE PROPIONATE 50 MCG/ACT NA SUSP
2.0000 | Freq: Every day | NASAL | Status: DC
Start: 2015-12-16 — End: 2019-11-25

## 2015-12-16 NOTE — ED Provider Notes (Signed)
CSN: [id number]     Arrival date & time 12/16/15  1542 History  By signing my name below, I, Elon SpannerGarrett Cook, attest that this documentation has been prepared under the direction and in the presence of Daira Hine, [place]. Electronically Signed: Elon SpannerGarrett Cook ED Scribe. 12/16/2015. 4:12 PM.    Chief Complaint  Patient presents with  . Nasal Congestion   The history is provided by the patient. No language interpreter was used.    HPI Comments:  Bryan Rivas is a 44 y.o. male who presents to the Emergency Department complaining of nasal congestion onset 3 days ago.   Associated symptoms include generalized body aches, sneezing, rhinorrhea, and ear popping. These symptoms are alleviated by Dayquil but pt continues to endorse nasal congestion. Denies headache, fever, chills, chest pain, SOB. Patient denies cough, sore throat, fever, ear pain. He states he has not had his flu shot this year. He is requesting a work note.  History reviewed. No pertinent past medical history. History reviewed. No pertinent past surgical history. No family history on file. Social History  Substance Use Topics  . Smoking status: Current Every Day Smoker -- 1.00 packs/day    Types: Cigarettes  . Smokeless tobacco: Never Used  . Alcohol Use: No    Review of Systems 10 Systems reviewed and all are negative for acute change except as noted in the HPI.  Allergies  Review of patient's allergies indicates no known allergies.  Home Medications   Prior to Admission medications   Medication Sig Start Date End Date Taking? Authorizing Provider  amoxicillin (AMOXIL) 500 MG capsule Take 1 capsule (500 mg total) by mouth 3 (three) times daily. 01/01/15   Nicole Pisciotta, PA-C  cyclobenzaprine (FLEXERIL) 10 MG tablet Take 1 tablet (10 mg total) by mouth 2 (two) times daily as needed for muscle spasms. 10/16/12   Roxy Horsemanobert Browning, PA-C  HYDROcodone-acetaminophen (VICODIN) 5-500 MG per tablet Take 1-2 tablets by mouth every 6  (six) hours as needed for pain. 10/16/12   Roxy Horsemanobert Browning, PA-C  ibuprofen (ADVIL,MOTRIN) 600 MG tablet Take 1 tablet (600 mg total) by mouth every 6 (six) hours as needed for pain. 10/16/12   Roxy Horsemanobert Browning, PA-C   BP 123/76 mmHg  Pulse 66  Temp(Src) 98.3 F (36.8 C) (Oral)  Resp 18  SpO2 98% Physical Exam  Constitutional: He is oriented to person, place, and time. He appears well-developed and well-nourished. No distress.  HENT:  Head: Normocephalic and atraumatic.  Right Ear: Tympanic membrane and external ear normal.  Left Ear: Tympanic membrane and external ear normal.  Nose: Mucosal edema and rhinorrhea present. Right sinus exhibits no maxillary sinus tenderness and no frontal sinus tenderness. Left sinus exhibits no maxillary sinus tenderness and no frontal sinus tenderness.  Mouth/Throat: Oropharynx is clear and moist.  Eyes: Conjunctivae and EOM are normal.  Neck: Neck supple. No tracheal deviation present.  Cardiovascular: Normal rate, regular rhythm and normal heart sounds.   Pulmonary/Chest: Effort normal and breath sounds normal. No respiratory distress. He has no wheezes.  Abdominal: Soft. Bowel sounds are normal. There is no tenderness.  Musculoskeletal: Normal range of motion. He exhibits no tenderness.  Neurological: He is alert and oriented to person, place, and time.  Skin: Skin is warm and dry. No rash noted.  Psychiatric: He has a normal mood and affect. His behavior is normal.  Nursing note and vitals reviewed.   ED Course  Procedures (including critical care time)  DIAGNOSTIC STUDIES: Oxygen Saturation is 98%  on RA, normal by my interpretation.    COORDINATION OF CARE:  4:12 PM Discussed treatment plan with patient at bedside.  Patient acknowledges and agrees with plan.    Labs Review Labs Reviewed - No data to display  Imaging Review No results found. I have personally reviewed and evaluated these images and lab results as part of my medical  decision-making.   EKG Interpretation None      MDM   Final diagnoses:  URI (upper respiratory infection)  Viral syndrome   Pt with nasal congestion, body aches, rhinorrhea, and sneezing for 3 days. He is afebrile and well appearing. Likely viral. Will give rx for flonase for nasal congestion and will give work note. Pt may take Dayquil or ibuprofen for body aches. Resource guide given to establish pcp for f/u. ER return precautions given.  I personally performed the services described in this documentation, which was scribed in my presence. The recorded information has been reviewed and is accurate. \  Carlene Coria, PA-C 12/16/15 1640  Bethann Berkshire, MD 12/17/15 1025

## 2015-12-16 NOTE — Discharge Instructions (Signed)
You were seen in the ER today for evaluation of cold symptoms. Your symptoms are most likely due to a virus. I will give you a prescription for flonase to use for nasal congestion as needed. You may continue taking the Dayquil you have at home if you feel like it is helping your symptoms.  You may also take ibuprofen as needed for body aches.  Take medications as prescribed. Return to the emergency room for worsening condition or new concerning symptoms. Follow up with your regular doctor. If you don't have a regular doctor use one of the numbers below to establish a primary care doctor.   Emergency Department Resource Guide 1) Find a Doctor and Pay Out of Pocket Although you won't have to find out who is covered by your insurance plan, it is a good idea to ask around and get recommendations. You will then need to call the office and see if the doctor you have chosen will accept you as a new patient and what types of options they offer for patients who are self-pay. Some doctors offer discounts or will set up payment plans for their patients who do not have insurance, but you will need to ask so you aren't surprised when you get to your appointment.  2) Contact Your Local Health Department Not all health departments have doctors that can see patients for sick visits, but many do, so it is worth a call to see if yours does. If you don't know where your local health department is, you can check in your phone book. The CDC also has a tool to help you locate your state's health department, and many state websites also have listings of all of their local health departments.  3) Find a Walk-in Clinic If your illness is not likely to be very severe or complicated, you may want to try a walk in clinic. These are popping up all over the country in pharmacies, drugstores, and shopping centers. They're usually staffed by nurse practitioners or physician assistants that have been trained to treat common illnesses  and complaints. They're usually fairly quick and inexpensive. However, if you have serious medical issues or chronic medical problems, these are probably not your best option.  No Primary Care Doctor: - Call Health Connect at  [phone] - they can help you locate a primary care doctor that  accepts your insurance, provides certain services, etc. - Physician Referral [phone]  Emergency Department Resource Guide 1) Find a Doctor and Pay Out of Pocket Although you won't have to find out who is covered by your insurance plan, it is a good idea to ask around and get recommendations. You will then need to call the office and see if the doctor you have chosen will accept you as a new patient and what types of options they offer for patients who are self-pay. Some doctors offer discounts or will set up payment plans for their patients who do not have insurance, but you will need to ask so you aren't surprised when you get to your appointment.  2) Contact Your Local Health Department Not all health departments have doctors that can see patients for sick visits, but many do, so it is worth a call to see if yours does. If you don't know where your local health department is, you can check in your phone book. The CDC also has a tool to help you locate your state's health department, and many state websites also have listings of all of  their local health departments.  3) Find a Walk-in Clinic If your illness is not likely to be very severe or complicated, you may want to try a walk in clinic. These are popping up all over the country in pharmacies, drugstores, and shopping centers. They're usually staffed by nurse practitioners or physician assistants that have been trained to treat common illnesses and complaints. They're usually fairly quick and inexpensive. However, if you have serious medical issues or chronic medical problems, these are probably not your best option.  No Primary Care  Doctor: - Call Health Connect at  [phone] - they can help you locate a primary care doctor that  accepts your insurance, provides certain services, etc. - Physician Referral Service- [phone]  Chronic Pain Problems: Organization         Address  Phone   Notes  [facility]  [phone] Patients need to be referred by their primary care doctor.   Medication Assistance: Organization         Address  Phone   Notes  [facility] Medication [facility] [address] [place], [place] [address] [phone] --Must be a resident of [facility] -- Must have NO insurance coverage whatsoever (no Medicaid/ Medicare, etc.) -- The pt. MUST have a primary care doctor that directs their care regularly and follows them in the community   [facility]  [phone]   [employer]  [phone]    Agencies that provide inexpensive medical care: Organization         Address  Phone   Notes  [facility]  [phone]   [facility]    [phone]   [facility] [address] [place], [place] [address] [phone]   [facility] [address] [place], [place] [phone]   [facility]anned [facility]    [phone]   [facility]    [phone]   [facility] and [facility]  [address], [place] Phone:  [phone], Fax:  [phone] Hours of Operation:  9 am - 6 pm, M-F.  Also accepts Medicaid/Medicare and self-pay.  [facility]  [address], [place] Phone: [phone], Fax: [phone]. Hours of Operation:  8:30 am - 5:30 pm, M-F.  Also accepts Medicaid and self-pay.  [facility] [place] [address], [place] Phone: [phone]   [facility] [address] Natasha Bence [place], [place] [phone], Ext. [phone] Mondays & Thursdays: 7-9 AM.  First 15 patients are seen on a first  come, first serve basis.    Medicaid-accepting [facility] Providers:  Organization         Address  Phone   Notes  [facility] [address], [place] [phone] Also accepts self-pay patients.  [facility] [address] Laurell Josephs [place], [place]  [phone]   [facility] [address], [place] [phone]   [facility] [address] [place] [phone]   Renaye Rakers [address], [place]   [phone] Only accepts [place] patients after they have their name applied to their card.   Self-Pay (no insurance) in [facility]:  Organization         Address  Phone   Notes  Sickle Cell  Patients, [facility] [address], [place] [phone]) [phone]   [facility] [address], [place] [phone]) [phone]   [facility] [place]  [address],  [phone]   [facility]/Dr. Osei-Bonsu  [address] [place] [address], [place] or [phone], [place] [phone]) [phone] Phone number for both [place] and [place] locations is the same.  [facility] [address], [place] [phone](336) [phone]   [facility] [place] [address] [place] [address], [place] or [address] [phone](336) [phone]) [phone]   [facility] [address], [place] [phone](336) [phone], phone; [phone](336) [phone], fax Sees patients 1st and 3rd Saturday of every month.  Must not qualify for public or private insurance (i.e. Medicaid, Medicare, [place] [employer] Choice, Veterans' Benefits)  Household income should be no more than 200% of the poverty level The clinic cannot treat you if you are pregnant or think you are pregnant  Sexually transmitted diseases are not treated at the clinic.

## 2015-12-16 NOTE — ED Notes (Signed)
Pt from home for eval of nasal congestion and body aches since Christmas Day, states was unable to go to work because he didn't feel good yesterday so came today. Denies any n/v/d at this time. Unknown if he had any fevers at home. Airway intact, pt axox 4.

## 2016-05-27 ENCOUNTER — Emergency Department (HOSPITAL_COMMUNITY)
Admission: EM | Admit: 2016-05-27 | Discharge: 2016-05-27 | Disposition: A | Discharge status: Home / Self Care | Payer: Managed Care, Other (non HMO) | Attending: Emergency Medicine | Admitting: Emergency Medicine

## 2016-05-27 ENCOUNTER — Encounter (HOSPITAL_COMMUNITY): Payer: Self-pay | Admitting: Emergency Medicine

## 2016-05-27 DIAGNOSIS — R112 Nausea with vomiting, unspecified: Secondary | ICD-10-CM | POA: Diagnosis not present

## 2016-05-27 DIAGNOSIS — F17210 Nicotine dependence, cigarettes, uncomplicated: Secondary | ICD-10-CM | POA: Diagnosis not present

## 2016-05-27 DIAGNOSIS — Z7951 Long term (current) use of inhaled steroids: Secondary | ICD-10-CM | POA: Diagnosis not present

## 2016-05-27 DIAGNOSIS — R197 Diarrhea, unspecified: Secondary | ICD-10-CM | POA: Insufficient documentation

## 2016-05-27 DIAGNOSIS — Z792 Long term (current) use of antibiotics: Secondary | ICD-10-CM | POA: Insufficient documentation

## 2016-05-27 DIAGNOSIS — R1084 Generalized abdominal pain: Secondary | ICD-10-CM | POA: Diagnosis present

## 2016-05-27 NOTE — ED Notes (Signed)
Pt reports having a "stomach bug" Tuesday with N/V/D.  Reports feeling better and denies symptoms at this time but needs a work note to go back to work today.

## 2016-05-27 NOTE — ED Notes (Signed)
Had some diarrhea last Tuesday and abd pain feels better today but just wants to be checked out

## 2016-05-27 NOTE — ED Provider Notes (Signed)
CSN: [id number]     Arrival date & time 05/27/16  1144 History  By signing my name below, I, Bryan Rivas, attest that this documentation has been prepared under the direction and in the presence of Kerrie Buffalo, NP  Electronically Signed: Gillis Ends. Lyn Hollingshead, ED Scribe. 05/27/2016. 12:19 PM.    Chief Complaint  Patient presents with  . Abdominal Pain    Patient is a 45 y.o. male presenting with abdominal pain. The history is provided by the patient. No language interpreter was used.  Abdominal Pain Pain location:  Generalized Onset quality:  Sudden Duration:  2 days Timing:  Intermittent Progression:  Resolved Context: sick contacts   Relieved by:  None tried Worsened by:  Nothing tried Ineffective treatments:  None tried Associated symptoms: diarrhea, nausea and vomiting   Associated symptoms: no fever     HPI Comments: Bryan Rivas is a 45 y.o. male who presents to the Emergency Department complaining of sudden onset, intermittent, abdominal pain onset 3 days ago. He says that abdominal pain and diarrhea began on 05/24/16 while he was on his way to work. Pt reports having 1 episode of vomiting and episodes of diarrhea. He notes that symptoms have resolved since Wednesday night. He says he had sick contact with his granddaughter who had similar symptoms. He did not take any medicines to resolve symptoms. There are no modifying factors. Pt is requesting a work note for absences the past two days. Denies any current diarrhea, nausea, vomiting.  History reviewed. No pertinent past medical history. History reviewed. No pertinent past surgical history. No family history on file. Social History  Substance Use Topics  . Smoking status: Current Every Day Smoker -- 1.00 packs/day    Types: Cigarettes  . Smokeless tobacco: Never Used  . Alcohol Use: No    Review of Systems  Constitutional: Negative for fever.  Gastrointestinal: Positive for nausea, vomiting, abdominal pain and  diarrhea.       All symptoms have resoled  All other systems reviewed and are negative.     Allergies  Review of patient's allergies indicates no known allergies.  Home Medications   Prior to Admission medications   Medication Sig Start Date End Date Taking? Authorizing Provider  amoxicillin (AMOXIL) 500 MG capsule Take 1 capsule (500 mg total) by mouth 3 (three) times daily. 01/01/15   Nicole Pisciotta, PA-C  cyclobenzaprine (FLEXERIL) 10 MG tablet Take 1 tablet (10 mg total) by mouth 2 (two) times daily as needed for muscle spasms. 10/16/12   Roxy Horseman, PA-C  fluticasone (FLONASE) 50 MCG/ACT nasal spray Place 2 sprays into both nostrils daily. 12/16/15   Ace Gins Sam, PA-C  HYDROcodone-acetaminophen (VICODIN) 5-500 MG per tablet Take 1-2 tablets by mouth every 6 (six) hours as needed for pain. 10/16/12   Roxy Horseman, PA-C  ibuprofen (ADVIL,MOTRIN) 600 MG tablet Take 1 tablet (600 mg total) by mouth every 6 (six) hours as needed for pain. 10/16/12   Roxy Horseman, PA-C   BP 105/83 mmHg  Pulse 66  Temp(Src) 98.1 F (36.7 C) (Oral)  Resp 16  SpO2 100% Physical Exam  Constitutional: He is oriented to person, place, and time. He appears well-developed and well-nourished.  HENT:  Head: Normocephalic and atraumatic.  Mouth/Throat: Oropharynx is clear and moist.  Uvula midline. No erythema or edema of the throat.  Eyes: EOM are normal. Pupils are equal, round, and reactive to light.  Neck: Normal range of motion. Neck supple.  Cardiovascular: Normal rate  and regular rhythm.   Pulmonary/Chest: Effort normal and breath sounds normal. No respiratory distress. He has no wheezes. He has no rales.  Abdominal: Soft. Bowel sounds are normal. He exhibits no distension. There is no tenderness. There is no rebound and no guarding.  Musculoskeletal: Normal range of motion.  Neurological: He is alert and oriented to person, place, and time.  Skin: Skin is warm and dry. No rash noted.   Psychiatric: He has a normal mood and affect. His behavior is normal. Judgment normal.  Nursing note and vitals reviewed.   ED Course  Procedures (including critical care time) DIAGNOSTIC STUDIES: Oxygen Saturation is 100% on RA, normal by my interpretation.    COORDINATION OF CARE: 12:10 PM-Discussed treatment plan with pt at bedside and pt agreed to plan. Will give pt work note for absences at work due to illness.  Labs Review Labs Reviewed - No data to display   MDM  45 y.o. male with hx of n/v/d that has resolved with symptomatic treatment at home. Here today for work note so that he may return tonight. Stable for d/c without complaints today. Return to work note given.   Final diagnoses:  Diarrhea, unspecified type   I personally performed the services described in this documentation, which was scribed in my presence. The recorded information has been reviewed and is accurate.    Janne NapoleonHope M Neese, NP 05/27/16 1223  Samuel JesterKathleen McManus, DO 05/28/16 [phone]

## 2017-01-24 ENCOUNTER — Emergency Department (HOSPITAL_COMMUNITY)
Admission: EM | Admit: 2017-01-24 | Discharge: 2017-01-24 | Disposition: A | Discharge status: Home / Self Care | Payer: Managed Care, Other (non HMO) | Attending: Emergency Medicine | Admitting: Emergency Medicine

## 2017-01-24 ENCOUNTER — Encounter (HOSPITAL_COMMUNITY): Payer: Self-pay | Admitting: Emergency Medicine

## 2017-01-24 DIAGNOSIS — F17210 Nicotine dependence, cigarettes, uncomplicated: Secondary | ICD-10-CM | POA: Diagnosis not present

## 2017-01-24 DIAGNOSIS — R509 Fever, unspecified: Secondary | ICD-10-CM | POA: Diagnosis present

## 2017-01-24 DIAGNOSIS — R05 Cough: Secondary | ICD-10-CM | POA: Insufficient documentation

## 2017-01-24 DIAGNOSIS — R059 Cough, unspecified: Secondary | ICD-10-CM

## 2017-01-24 NOTE — Discharge Instructions (Signed)
Follow up with your primary care provider as needed.  Return to ER for new or worsening symptoms, any additional concerns.

## 2017-01-24 NOTE — ED Triage Notes (Signed)
Pt reports flu like symptoms since Thursday, unable to see pcp until later this week. Pt reports feeling much better but needs a note for work.

## 2017-01-24 NOTE — ED Provider Notes (Signed)
[facility]-EMERGENCY DEPT Provider Note   CSN: [id number] Arrival date & time: 01/24/17  1611   By signing my name below, I, Bryan Rivas, attest that this documentation has been prepared under the direction and in the presence of Bryan Rivas, Bryan Rivas. Electronically Signed: Freida Busmaniana Rivas, Scribe. 01/24/2017. 7:51 PM.   History   Chief Complaint Chief Complaint  Patient presents with  . URI     The history is provided by the patient. No language interpreter was used.     HPI Comments:  Bryan Rivas is a 46 y.o. male who presents to the Emergency Department complaining of generalized body aches with associated sneezing, subjective fever, chills, and congestion which began 5 days ago. He reports taking OTC meds with moderate relief. Pt notes he missed work due to his symptoms and is requesting a work note for the last 2 days. He denies diarrhea, nausea, and vomiting.   History reviewed. No pertinent past medical history.  There are no active problems to display for this patient.   History reviewed. No pertinent surgical history.     Home Medications    Prior to Admission medications   Medication Sig Start Date End Date Taking? Authorizing Provider  amoxicillin (AMOXIL) 500 MG capsule Take 1 capsule (500 mg total) by mouth 3 (three) times daily. 01/01/15   Bryan Rivas, Bryan Rivas  cyclobenzaprine (FLEXERIL) 10 MG tablet Take 1 tablet (10 mg total) by mouth 2 (two) times daily as needed for muscle spasms. 10/16/12   Bryan Rivas, Bryan Rivas  fluticasone (FLONASE) 50 MCG/ACT nasal spray Place 2 sprays into both nostrils daily. 12/16/15   Bryan Rivas, Bryan Rivas  HYDROcodone-acetaminophen (VICODIN) 5-500 MG per tablet Take 1-2 tablets by mouth every 6 (six) hours as needed for pain. 10/16/12   Bryan Rivas, Bryan Rivas  ibuprofen (ADVIL,MOTRIN) 600 MG tablet Take 1 tablet (600 mg total) by mouth every 6 (six) hours as needed for pain. 10/16/12   Bryan Rivas, Bryan Rivas    Family History No family  history on file.  Social History Social History  Substance Use Topics  . Smoking status: Current Every Day Smoker    Packs/day: 1.00    Types: Cigarettes  . Smokeless tobacco: Never Used  . Alcohol use No     Allergies   Patient has no known allergies.   Review of Systems Review of Systems  Constitutional: Positive for chills and fever.  HENT: Positive for congestion and sneezing.   Gastrointestinal: Negative for diarrhea, nausea and vomiting.     Physical Exam Updated Vital Signs BP 121/75 (BP Location: Left Arm)   Pulse 60   Temp 98 F (36.7 C)   Resp 12   Ht 5\' 9"  (1.753 m)   Wt 145 lb (65.8 kg)   SpO2 100%   BMI 21.41 kg/m   Physical Exam  Constitutional: He is oriented to person, place, and time. He appears well-developed and well-nourished. No distress.  HENT:  Head: Normocephalic and atraumatic.  Cardiovascular: Normal rate, regular rhythm and normal heart sounds.   No murmur heard. Pulmonary/Chest: Effort normal and breath sounds normal. No respiratory distress.  Abdominal: Soft. He exhibits no distension. There is no tenderness.  Musculoskeletal: He exhibits no edema.  Neurological: He is alert and oriented to person, place, and time.  Skin: Skin is warm and dry.  Nursing note and vitals reviewed.    ED Treatments / Results  DIAGNOSTIC STUDIES:  Oxygen Saturation is 100% on RA, normal by my interpretation.  COORDINATION OF CARE:  7:50 PM Discussed treatment plan with pt at bedside and pt agreed to plan.  Labs (all labs ordered are listed, but only abnormal results are displayed) Labs Reviewed - No data to display  EKG  EKG Interpretation None       Radiology No results found.  Procedures Procedures (including critical care time)  Medications Ordered in ED Medications - No data to display   Initial Impression / Assessment and Plan / ED Course  I have reviewed the triage vital signs and the nursing notes.  Pertinent labs &  imaging results that were available during my care of the patient were reviewed by me and considered in my medical decision making (see chart for details).      Bryan Rivas is a 46 y.o. male who presents to ED for gradually improving URI symptoms requesting work note for the weekend. Work note for today only given. Lungs CTA, OP clear, vitals reassuring. Evaluation does not show pathology that would require ongoing emergent intervention or inpatient treatment. All questions answered.      Final Clinical Impressions(s) / ED Diagnoses   Final diagnoses:  None    New Prescriptions New Prescriptions   No medications on file   I personally performed the services described in this documentation, which was scribed in my presence. The recorded information has been reviewed and is accurate.     [facility] Rivas, Bryan Rivas 01/24/17 2154    Bryan Dibbles, MD 01/25/17 1600

## 2017-01-24 NOTE — ED Notes (Signed)
Pt c/o fever and congestion since last Thursday. Pt states unable toi see MD today.

## 2017-08-14 ENCOUNTER — Emergency Department (HOSPITAL_COMMUNITY): Payer: Managed Care, Other (non HMO)

## 2017-08-14 ENCOUNTER — Encounter (HOSPITAL_COMMUNITY): Payer: Self-pay | Admitting: Emergency Medicine

## 2017-08-14 ENCOUNTER — Emergency Department (HOSPITAL_COMMUNITY)
Admission: EM | Admit: 2017-08-14 | Discharge: 2017-08-15 | Disposition: A | Discharge status: Home / Self Care | Payer: Managed Care, Other (non HMO) | Attending: Emergency Medicine | Admitting: Emergency Medicine

## 2017-08-14 DIAGNOSIS — Y999 Unspecified external cause status: Secondary | ICD-10-CM | POA: Insufficient documentation

## 2017-08-14 DIAGNOSIS — M545 Low back pain, unspecified: Secondary | ICD-10-CM

## 2017-08-14 DIAGNOSIS — F17210 Nicotine dependence, cigarettes, uncomplicated: Secondary | ICD-10-CM | POA: Insufficient documentation

## 2017-08-14 DIAGNOSIS — Y929 Unspecified place or not applicable: Secondary | ICD-10-CM | POA: Insufficient documentation

## 2017-08-14 DIAGNOSIS — Y9389 Activity, other specified: Secondary | ICD-10-CM | POA: Insufficient documentation

## 2017-08-14 DIAGNOSIS — Z79899 Other long term (current) drug therapy: Secondary | ICD-10-CM | POA: Insufficient documentation
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
## 2017-08-15 MED ORDER — METHOCARBAMOL 500 MG PO TABS: 500.0000 mg | ORAL_TABLET | Freq: Two times a day (BID) | ORAL | 0 refills | Status: DC

## 2017-08-15 MED ORDER — NAPROXEN 500 MG PO TABS
500.0000 mg | ORAL_TABLET | Freq: Two times a day (BID) | ORAL | 0 refills | Status: DC
Start: 2017-08-15 — End: 2019-11-25

## 2017-08-15 NOTE — ED Notes (Signed)
PT states understanding of care given, follow up care, and medication prescribed. PT ambulated from ED to car with a steady gait. 

## 2017-08-15 NOTE — Discharge Instructions (Signed)
Back Pain:   Your back pain should be treated with medicines such as ibuprofen or aleve and this back pain should get better over the next 2 weeks.  However if you develop severe or worsening pain, low back pain with fever, numbness, weakness or inability to walk or urinate, you should return to the ER immediately.  Please follow up with your doctor this week for a recheck if still having symptoms. Low back pain is discomfort in the lower back that may be due to injuries to muscles and ligaments around the spine.  Occasionally, it may be caused by a a problem to a part of the spine called a disc.  The pain may last several days or a week;  However, most patients get completely well in 4 weeks.  Self - care:  The application of heat can help soothe the pain.  Maintaining your daily activities, including walking, is encourged, as it will help you get better faster than just staying in bed.  Medications are also useful to help with pain control.  A commonly prescribed medications includes acetaminophen.  This medication is generally safe, though you should not take more than 8 of the extra strength (500mg ) pills a day.  Non steroidal anti inflammatory medications including Ibuprofen and naproxen;  These medications help both pain and swelling and are very useful in treating back pain.  They should be taken with food, as they can cause stomach upset, and more seriously, stomach bleeding.    Muscle relaxants:  These medications can help with muscle tightness that is a cause of lower back pain.  Most of these medications can cause drowsiness, and it is not safe to drive or use dangerous machinery while taking them.  You will need to follow up with your primary healthcare provider in 1-2 weeks for reassessment.For persistent symptoms you will need to follow up with Orthopedics. You do have narrowing in one of your vertebral spaces as we discussed. This does not appear to be acute.   Be aware that if you  develop new symptoms, such as a fever, leg weakness, difficulty with or loss of control of your urine or bowels, abdominal pain, or more severe pain, you will need to seek medical attention and  / or return to the Emergency department.

## 2018-06-12 ENCOUNTER — Emergency Department (HOSPITAL_COMMUNITY)
Admission: EM | Admit: 2018-06-12 | Discharge: 2018-06-13 | Disposition: A | Discharge status: Home / Self Care | Payer: Managed Care, Other (non HMO) | Attending: Emergency Medicine | Admitting: Emergency Medicine

## 2018-06-12 ENCOUNTER — Encounter (HOSPITAL_COMMUNITY): Payer: Self-pay | Admitting: Emergency Medicine

## 2018-06-12 DIAGNOSIS — R0981 Nasal congestion: Secondary | ICD-10-CM | POA: Diagnosis present

## 2018-06-12 DIAGNOSIS — F17210 Nicotine dependence, cigarettes, uncomplicated: Secondary | ICD-10-CM | POA: Diagnosis not present

## 2018-06-12 DIAGNOSIS — J00 Acute nasopharyngitis [common cold]: Secondary | ICD-10-CM | POA: Diagnosis not present

## 2018-06-12 DIAGNOSIS — Z79899 Other long term (current) drug therapy: Secondary | ICD-10-CM | POA: Insufficient documentation

## 2018-06-12 NOTE — ED Triage Notes (Signed)
Patient c/o cough and congestion x3 days. Denies N/V/D and abdominal pain.  Denies chest pain and SOB.

## 2018-06-13 MED ORDER — CETIRIZINE HCL 10 MG PO TABS: 10.0000 mg | ORAL_TABLET | Freq: Every day | ORAL | 0 refills | Status: DC

## 2018-06-13 NOTE — ED Provider Notes (Signed)
[facility]-EMERGENCY DEPT Provider Note   CSN: [id number] Arrival date & time: 06/12/18  2235     History   Chief Complaint Chief Complaint  Patient presents with  . Cough  . Nasal Congestion    HPI Bryan Rivas is a 47 y.o. male.  Patient presents to the emergency department with a chief complaint of cough and cold symptoms.  He states that his symptoms started on Saturday.  He states that he knew that he was catching a cold when he sneezed 4 times.  He denies any fever chills, but states that he has felt warm.  He reports associated nasal congestion and sore throat.  He states that his cough is only mild.  He has tried taking DayQuil and NyQuil with some relief.  He is also requesting a note for work.  The history is provided by the patient. No language interpreter was used.    History reviewed. No pertinent past medical history.  There are no active problems to display for this patient.   History reviewed. No pertinent surgical history.      Home Medications    Prior to Admission medications   Medication Sig Start Date End Date Taking? Authorizing Provider  amoxicillin (AMOXIL) 500 MG capsule Take 1 capsule (500 mg total) by mouth 3 (three) times daily. Patient not taking: Reported on 08/14/2017 01/01/15   Pisciotta, Joni ReiningNicole, PA-C  cyclobenzaprine (FLEXERIL) 10 MG tablet Take 1 tablet (10 mg total) by mouth 2 (two) times daily as needed for muscle spasms. Patient not taking: Reported on 08/14/2017 10/16/12   Roxy HorsemanBrowning, Bartley Vuolo, PA-C  fluticasone [facility](FLONASE) 50 MCG/ACT nasal spray Place 2 sprays into both nostrils daily. Patient not taking: Reported on 08/14/2017 12/16/15   Carlene CoriaSam, Serena Y, PA-C  HYDROcodone-acetaminophen (VICODIN) 5-500 MG per tablet Take 1-2 tablets by mouth every 6 (six) hours as needed for pain. Patient not taking: Reported on 08/14/2017 10/16/12   Roxy HorsemanBrowning, Mette Southgate, PA-C  ibuprofen (ADVIL,MOTRIN) 600 MG tablet Take 1 tablet (600 mg  total) by mouth every 6 (six) hours as needed for pain. Patient not taking: Reported on 08/14/2017 10/16/12   Roxy HorsemanBrowning, Ishana Blades, PA-C  methocarbamol (ROBAXIN) 500 MG tablet Take 1 tablet (500 mg total) by mouth 2 (two) times daily. 08/15/17   Maczis, Elmer SowMichael M, PA-C  naproxen (NAPROSYN) 500 MG tablet Take 1 tablet (500 mg total) by mouth 2 (two) times daily. 08/15/17   Maczis, Elmer SowMichael M, PA-C  naproxen sodium (ALEVE) 220 MG tablet Take 220-440 mg by mouth 2 (two) times daily as needed (for back pain).    [provider]    Family History No family history on file.  Social History Social History   Tobacco Use  . Smoking status: Current Every Day Smoker    Packs/day: 1.00    Types: Cigarettes  . Smokeless tobacco: Never Used  Substance Use Topics  . Alcohol use: No  . Drug use: No     Allergies   Patient has no known allergies.   Review of Systems Review of Systems  Constitutional: Negative for chills and fever.  HENT: Positive for postnasal drip, rhinorrhea, sinus pressure, sneezing and sore throat.   Respiratory: Positive for cough. Negative for shortness of breath.   Cardiovascular: Negative for chest pain.  Gastrointestinal: Negative for abdominal pain, constipation, diarrhea, nausea and vomiting.  Genitourinary: Negative for dysuria.     Physical Exam Updated Vital Signs BP (!) 135/92 (BP Location: Right Arm)   Pulse 68  Temp 97.7 F (36.5 C) (Oral)   Resp 18   SpO2 99%   Physical Exam Physical Exam  Constitutional: Pt  is oriented to person, place, and time. Appears well-developed and well-nourished. No distress.  HENT:  Head: Normocephalic and atraumatic.  Right Ear: Tympanic membrane, external ear and ear canal normal.  Left Ear: Tympanic membrane, external ear and ear canal normal.  Nose: Mucosal edema and moderate rhinorrhea present. No epistaxis. Right sinus exhibits no maxillary sinus tenderness and no frontal sinus tenderness. Left sinus  exhibits no maxillary sinus tenderness and no frontal sinus tenderness.  Mouth/Throat: Uvula is midline and mucous membranes are normal. Mucous membranes are not pale and not cyanotic. No oropharyngeal exudate, posterior oropharyngeal edema, posterior oropharyngeal erythema or tonsillar abscesses.  Eyes: Conjunctivae are normal. Pupils are equal, round, and reactive to light.  Neck: Normal range of motion and full passive range of motion without pain.  Cardiovascular: Normal rate and intact distal pulses.   Pulmonary/Chest: Effort normal and breath sounds normal. No stridor.  Clear and equal breath sounds without focal wheezes, rhonchi, rales  Abdominal: Soft. Bowel sounds are normal. There is no tenderness.  Musculoskeletal: Normal range of motion.  Lymphadenopathy:    Pthas no cervical adenopathy.  Neurological: Pt is alert and oriented to person, place, and time.  Skin: Skin is warm and dry. No rash noted. Pt is not diaphoretic.  Psychiatric: Normal mood and affect.  Nursing note and vitals reviewed.    ED Treatments / Results  Labs (all labs ordered are listed, but only abnormal results are displayed) Labs Reviewed - No data to display  EKG None  Radiology No results found.  Procedures Procedures (including critical care time)  Medications Ordered in ED Medications - No data to display   Initial Impression / Assessment and Plan / ED Course  I have reviewed the triage vital signs and the nursing notes.  Pertinent labs & imaging results that were available during my care of the patient were reviewed by me and considered in my medical decision making (see chart for details).     Patients symptoms are consistent with URI, likely viral etiology. Discussed that antibiotics are not indicated for viral infections. Pt will be discharged with symptomatic treatment.  Verbalizes understanding and is agreeable with plan. Pt is hemodynamically stable & in NAD prior to dc.   Final  Clinical Impressions(s) / ED Diagnoses   Final diagnoses:  Acute nasopharyngitis    ED Discharge Orders        Ordered    cetirizine (ZYRTEC ALLERGY) 10 MG tablet  Daily     06/13/18 0010       Roxy Horseman, PA-C 06/13/18 0010    Palumbo, April, MD 06/13/18 0151

## 2018-06-13 NOTE — ED Notes (Signed)
Pt educated on normal BP, Pt will check it again this week and make sure reading is lower.
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
## 2019-11-04 ENCOUNTER — Other Ambulatory Visit: Payer: Self-pay | Admitting: *Deleted

## 2019-11-04 DIAGNOSIS — Z20822 Contact with and (suspected) exposure to covid-19: Secondary | ICD-10-CM

## 2019-11-06 ENCOUNTER — Encounter: Payer: Self-pay | Admitting: Nurse Practitioner

## 2019-11-06 LAB — NOVEL CORONAVIRUS, NAA: SARS-CoV-2, NAA: NOT DETECTED

## 2019-11-06 NOTE — Telephone Encounter (Signed)
ERROR

## 2019-11-23 NOTE — Progress Notes (Signed)
11/23/2019 Bryan Rivas [id number] 1971/12/09   HISTORY OF PRESENT ILLNESS: Bryan Rivas is a 48 year old male without any significant health problems.  No surgical history.  He developed anxiety, stomach pain with associated bad taste and bad breath approximately 2 months ago.  He denies having any specific stress triggers.  He was evaluated by his primary care physician who prescribed Lexapro 10 mg once daily and Ativan 0.5 mg twice daily as needed.  He also took Tums as needed.  He took the Lexapro for approximately 2 weeks then stopped it as he no longer felt anxious.  His stomach pain and acid taste in his mouth have also improved.  He had a little left upper quadrant pain yesterday which resolved.  Eating food does not improve or worsen his stomach pain.  He denies having any dysphagia.  No nausea or vomiting.  No lower abdominal pain.  He is passing a normal formed brown stool daily.  No rectal bleeding or black stools.  He rarely takes NSAIDs.  He took Tylenol yesterday for a headache.  He may be lost 2 pounds in the past month.  No fever but sometimes has sweats at night.  He previously smoked marijuana daily for 15 years, he stopped smoking marijuana 1 month ago.  He smokes cigarettes less than 1 pack/day for the past 10 years.  No alcohol use.  He works for [employer] center and he works the night shift.  He has difficulty sleeping since working nights.  Past Medical History:  Diagnosis Date  . Anxiety    History reviewed. No pertinent surgical history.   Current Outpatient Medications on File Prior to Visit  Medication Sig Dispense Refill  . acetaminophen (TYLENOL) 325 MG tablet Take 650 mg by mouth as needed.    Marland Kitchen escitalopram (LEXAPRO) 10 MG tablet Take 10 mg by mouth daily as needed.     Marland Kitchen LORazepam (ATIVAN) 0.5 MG tablet Take by mouth 2 (two) times daily as needed.     No current facility-administered medications on file prior to visit.    Family  History  Problem Relation Age of Onset  . Prostate cancer Father   . Colon cancer Neg Hx    No Known Allergies   REVIEW OF SYSTEMS  : All other systems reviewed and negative except where noted in the History of Present Illness.  PHYSICAL EXAM: BP 114/72   Pulse 73   Temp 98.7 F (37.1 C)   Bryan Rivas 5' 8.75" (1.746 m)   Wt 135 lb (61.2 kg)   BMI 20.08 kg/m   General: Thin 48 year old male in no acute distress Head: Normocephalic and atraumatic Eyes:  Sclerae anicteric,conjunctive pink. Ears: Normal auditory acuity Mouth: Dentition intact, no ulcers or lesions, posterior pharynx is pink Neck: Supple, no masses.  Lungs: Clear throughout to auscultation Heart: Regular rate and rhythm Abdomen: Soft, flat, nontender, non distended. No masses or hepatomegaly noted. Normal bowel sounds x 4 quads Rectal: Deferred. Musculoskeletal: Symmetrical with no gross deformities  Skin: No lesions on visible extremities Extremities: No edema  Neurological: Alert oriented x 4, grossly nonfocal Cervical Nodes:  No significant cervical adenopathy Psychological:  Alert and cooperative. Normal mood and affect  ASSESSMENT AND PLAN:  1. LUQ pain, acid reflux symptoms  -Request copy of labs done by PCP -H. Pylori breath test -After H. Pylori Breath test completed start Famotidine  20mg  one tab daily -Follow up in office in 6  weeks -EGD discussed if no improvement or if symptoms worsen -GERD diet discussed  2. Anxiety -follow up with PCP      CC:  [place] [place] [facility]

## 2019-11-25 ENCOUNTER — Encounter: Payer: Self-pay | Admitting: Nurse Practitioner

## 2019-11-25 ENCOUNTER — Ambulatory Visit (INDEPENDENT_AMBULATORY_CARE_PROVIDER_SITE_OTHER): Payer: Managed Care, Other (non HMO) | Admitting: Nurse Practitioner

## 2019-11-25 VITALS — BP 114/72 | HR 73 | Temp 98.7°F | Ht 68.75 in | Wt 135.0 lb

## 2019-11-25 DIAGNOSIS — R1012 Left upper quadrant pain: Secondary | ICD-10-CM | POA: Insufficient documentation

## 2019-11-25 DIAGNOSIS — K219 Gastro-esophageal reflux disease without esophagitis: Secondary | ICD-10-CM

## 2019-11-25 DIAGNOSIS — F419 Anxiety disorder, unspecified: Secondary | ICD-10-CM

## 2019-11-25 DIAGNOSIS — F411 Generalized anxiety disorder: Secondary | ICD-10-CM

## 2019-11-25 MED ORDER — FAMOTIDINE 20 MG PO TABS: 20.0000 mg | ORAL_TABLET | Freq: Every day | ORAL | 1 refills | Status: AC

## 2019-11-25 NOTE — Progress Notes (Signed)
Reviewed and agree with management plan.  Amalya Salmons T. Carina Chaplin, MD FACG [facility]  

## 2019-11-25 NOTE — Patient Instructions (Signed)
If you are age 48 or older, your body mass index should be between 23-30. Your Body mass index is 20.08 kg/m. If this is out of the aforementioned range listed, please consider follow up with your Primary Care Provider.  If you are age 27 or younger, your body mass index should be between 19-25. Your Body mass index is 20.08 kg/m. If this is out of the aformentioned range listed, please consider follow up with your Primary Care Provider.    We have given you a printed order to take to a [place] facility for an H. Pylori breath test.  We have sent the following medications to your pharmacy for you to pick up at your convenience:  Famotadine  Thank you for choosing me and [facility]

## 2020-01-04 LAB — H. PYLORI BREATH TEST: H pylori Breath Test: NEGATIVE

## 2022-06-19 ENCOUNTER — Other Ambulatory Visit: Payer: Self-pay

## 2022-06-19 ENCOUNTER — Encounter (HOSPITAL_COMMUNITY): Payer: Self-pay

## 2022-06-19 ENCOUNTER — Emergency Department (HOSPITAL_COMMUNITY)
Admission: EM | Admit: 2022-06-19 | Discharge: 2022-06-19 | Disposition: A | Discharge status: Home / Self Care | Payer: Managed Care, Other (non HMO) | Attending: Emergency Medicine | Admitting: Emergency Medicine

## 2022-06-19 DIAGNOSIS — R519 Headache, unspecified: Secondary | ICD-10-CM | POA: Insufficient documentation

## 2022-06-19 DIAGNOSIS — K4090 Unilateral inguinal hernia, without obstruction or gangrene, not specified as recurrent: Secondary | ICD-10-CM | POA: Insufficient documentation

## 2022-06-19 NOTE — ED Notes (Signed)
Pt d/c home per MD order. Discharge summary reviewed with pt, pt verbalizes understanding. Ambulatory off unit. No s/s of acute distress noted at discharge.  °

## 2022-06-19 NOTE — ED Provider Notes (Signed)
[facility]-EMERGENCY DEPT Provider Note   CSN: [id number] Arrival date & time: 06/19/22  2029     History  Chief Complaint  Patient presents with   Inguinal Hernia    Bryan Rivas is a 51 y.o. male.  HPI Patient presents for evaluation of 2 problems.  He is concerned about a inguinal hernia that comes and goes.  Earlier today he awoke from sleep and it popped out as he was urinating.  He states that it is about 3 cm in diameter and is in his left groin.  It spontaneously resolved after he walked around a bit.  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  He does not do heavy lifting but has to walk a lot.  He also complains of an ongoing headache for several months which comes and goes.  He takes Tylenol or Motrin but headache still comes back.  He does not have ear pain, sinus pain, sore throat, neck pain, weakness or paresthesia.    Home Medications Prior to Admission medications   Medication Sig Start Date End Date Taking? Authorizing Provider  acetaminophen (TYLENOL) 500 MG tablet Take 500-1,000 mg by mouth every 6 (six) hours as needed (for headaches).   Yes [provider]  Aspirin-Salicylamide-Caffeine (BC HEADACHE POWDER PO) Take 1 packet by mouth as needed (for headaches).   Yes [provider]  famotidine (PEPCID) 20 MG tablet Take 1 tablet (20 mg total) by mouth daily. Patient not taking: Reported on 06/19/2022 11/25/19   Arnaldo Natal, NP      Allergies    Patient has no known allergies.    Review of Systems   Review of Systems  Physical Exam Updated Vital Signs BP 124/78 (BP Location: Left Arm)   Pulse (!) 59   Temp 98.2 F (36.8 C) (Oral)   Resp 16   SpO2 100%  Physical Exam Vitals and nursing note reviewed.  Constitutional:      Appearance: He is well-developed. He is not ill-appearing.  HENT:     Head: Normocephalic and atraumatic.     Right Ear:  External ear normal.     Left Ear: External ear normal.  Eyes:     Conjunctiva/sclera: Conjunctivae normal.     Pupils: Pupils are equal, round, and reactive to light.  Neck:     Trachea: Phonation normal.  Cardiovascular:     Rate and Rhythm: Normal rate.  Pulmonary:     Effort: Pulmonary effort is normal.  Abdominal:     General: There is no distension.     Comments: Left groin with small indistinct bulge, nontender to palpation.  No distinct palpable hernia.  Otherwise normal genital region.  Musculoskeletal:        General: Normal range of motion.     Cervical back: Normal range of motion and neck supple.  Skin:    General: Skin is warm and dry.  Neurological:     Mental Status: He is alert and oriented to person, place, and time.     Cranial Nerves: No cranial nerve deficit.     Sensory: No sensory deficit.     Motor: No abnormal muscle tone.     Coordination: Coordination normal.  Psychiatric:        Mood and Affect: Mood normal.        Behavior: Behavior normal.        Thought Content: Thought  content normal.        Judgment: Judgment normal.     ED Results / Procedures / Treatments   Labs (all labs ordered are listed, but only abnormal results are displayed) Labs Reviewed - No data to display  EKG None  Radiology No results found.  Procedures Procedures    Medications Ordered in ED Medications - No data to display  ED Course/ Medical Decision Making/ A&P                           Medical Decision Making Risk Risk Details: Patient complaining of intermittent left groin hernia that popped earlier then resolved.  He has not previously had it evaluated.  Is been going on for several months.  He is also having intermittent headache for several months.  I offered him CT imaging, to make sure there was no serious intracranial abnormality.  He deferred stating that he would prefer to see his PCP to arrange that.  I have instructed him to go home and rest, avoid  lifting and use Tylenol or Motrin for pain.  He does not require hospitalization at this time.           Final Clinical Impression(s) / ED Diagnoses Final diagnoses:  Left inguinal hernia  Nonintractable headache, unspecified chronicity pattern, unspecified headache type    Rx / DC Orders ED Discharge Orders     None         Mancel Bale, MD 06/19/22 2222

## 2022-06-19 NOTE — ED Triage Notes (Signed)
Patient said he thinks he has a hernia in his left groin. He said he thinks he popped it back in now. Also complaining of a headache that he has had for the last 4 months. He said he "just knows its there."

## 2022-06-19 NOTE — Discharge Instructions (Signed)
It appears that you have a hernia on the left groin.  Since it comes and goes you do not need surgery tonight.  Call the surgery office for an appointment to be seen for further care and treatment of the hernia.  For the headache, take Tylenol or Motrin.  See your doctor to get checked out for the headache since it has been going on a while.

## 2022-11-03 ENCOUNTER — Ambulatory Visit: Admit: 2022-11-03 | Payer: Managed Care, Other (non HMO) | Admitting: Surgery

## 2022-11-03 SURGERY — REPAIR, HERNIA, INGUINAL, LAPAROSCOPIC
Anesthesia: General | Laterality: Left

## 2023-02-03 ENCOUNTER — Other Ambulatory Visit: Payer: Self-pay

## 2023-02-03 ENCOUNTER — Emergency Department (HOSPITAL_COMMUNITY)
Admission: EM | Admit: 2023-02-03 | Discharge: 2023-02-03 | Disposition: A | Discharge status: Home / Self Care | Payer: Managed Care, Other (non HMO) | Attending: Emergency Medicine | Admitting: Emergency Medicine

## 2023-02-03 DIAGNOSIS — U071 COVID-19: Secondary | ICD-10-CM | POA: Insufficient documentation

## 2023-02-03 DIAGNOSIS — R059 Cough, unspecified: Secondary | ICD-10-CM | POA: Diagnosis present

## 2023-02-03 DIAGNOSIS — Z7982 Long term (current) use of aspirin: Secondary | ICD-10-CM | POA: Diagnosis not present

## 2023-02-03 LAB — I-STAT CREATININE, ED: Creatinine, Ser: 0.9 mg/dL (ref 0.61–1.24)

## 2023-02-03 LAB — RESP PANEL BY RT-PCR (RSV, FLU A&B, COVID)  RVPGX2
Influenza A by PCR: NEGATIVE
Influenza B by PCR: NEGATIVE
Resp Syncytial Virus by PCR: NEGATIVE
SARS Coronavirus 2 by RT PCR: POSITIVE — AB

## 2023-02-03 MED ORDER — NIRMATRELVIR/RITONAVIR (PAXLOVID)TABLET: 3.0000 | ORAL_TABLET | Freq: Two times a day (BID) | ORAL | 0 refills | Status: AC

## 2023-02-03 MED ORDER — KETOROLAC TROMETHAMINE 15 MG/ML IJ SOLN
15.0000 mg | Freq: Once | INTRAMUSCULAR | Status: AC
  Administered 2023-02-03: 15 mg via INTRAMUSCULAR
  Filled 2023-02-03: qty 1

## 2023-02-03 MED ORDER — ACETAMINOPHEN 500 MG PO TABS
1000.0000 mg | ORAL_TABLET | Freq: Once | ORAL | Status: AC
  Administered 2023-02-03: 1000 mg via ORAL
  Filled 2023-02-03: qty 2

## 2023-02-03 NOTE — ED Provider Notes (Signed)
[place] EMERGENCY DEPARTMENT AT [facility] Provider Note   CSN: [id number] Arrival date & time: 02/03/23  [id number]     History  Chief Complaint  Patient presents with   Weakness    Bryan Rivas is a 52 y.o. male with medical history of anxiety.  Patient presents to ED for evaluation of bodyaches and chills, headache, cough, weakness, fever.  Patient reports that the symptoms been ongoing for 2 days.  Patient unsure of sick contacts, reports that he works third shift.  Patient denies chest pain, shortness of breath, abdominal pain, nausea, vomiting, diarrhea, sore throat.   Weakness Associated symptoms: cough, fever, headaches and myalgias        Home Medications Prior to Admission medications   Medication Sig Start Date End Date Taking? Authorizing Provider  nirmatrelvir/ritonavir (PAXLOVID) 20 x 150 MG & 10 x 100MG TABS Take 3 tablets by mouth 2 (two) times daily for 5 days. Patient GFR is greater than 60. Take nirmatrelvir (150 mg) two tablets twice daily for 5 days and ritonavir (100 mg) one tablet twice daily for 5 days. 02/03/23 02/08/23 Yes Azucena Cecil, PA-C  acetaminophen (TYLENOL) 500 MG tablet Take 500-1,000 mg by mouth every 6 (six) hours as needed (for headaches).    [provider]  Aspirin-Salicylamide-Caffeine (BC HEADACHE POWDER PO) Take 1 packet by mouth as needed (for headaches).    [provider]  famotidine (PEPCID) 20 MG tablet Take 1 tablet (20 mg total) by mouth daily. Patient not taking: Reported on 06/19/2022 11/25/19   Noralyn Pick, NP      Allergies    Patient has no known allergies.    Review of Systems   Review of Systems  Constitutional:  Positive for chills and fever.  Respiratory:  Positive for cough.   Musculoskeletal:  Positive for myalgias.  Neurological:  Positive for weakness and headaches.  All other systems reviewed and are negative.   Physical Exam Updated Vital Signs BP 125/70  (BP Location: Right Arm)   Pulse 70   Temp 100.3 F (37.9 C) (Oral)   Resp 18   Ht 5' 8"$  (1.727 m)   Wt 65.8 kg   SpO2 96%   BMI 22.05 kg/m  Physical Exam Vitals and nursing note reviewed.  Constitutional:      General: He is not in acute distress.    Appearance: Normal appearance. He is not ill-appearing, toxic-appearing or diaphoretic.  HENT:     Head: Normocephalic and atraumatic.     Nose: Nose normal.     Mouth/Throat:     Mouth: Mucous membranes are moist.     Pharynx: Posterior oropharyngeal erythema present. No oropharyngeal exudate.  Eyes:     Extraocular Movements: Extraocular movements intact.     Conjunctiva/sclera: Conjunctivae normal.     Pupils: Pupils are equal, round, and reactive to light.  Cardiovascular:     Rate and Rhythm: Normal rate and regular rhythm.  Pulmonary:     Effort: Pulmonary effort is normal.     Breath sounds: Normal breath sounds. No wheezing.  Abdominal:     General: Abdomen is flat. Bowel sounds are normal.     Palpations: Abdomen is soft.     Tenderness: There is no abdominal tenderness.  Musculoskeletal:     Cervical back: Normal range of motion and neck supple. No tenderness.  Skin:    General: Skin is warm and dry.     Capillary Refill: Capillary refill takes less  than 2 seconds.  Neurological:     General: No focal deficit present.     Mental Status: He is alert and oriented to person, place, and time.     GCS: GCS eye subscore is 4. GCS verbal subscore is 5. GCS motor subscore is 6.     Cranial Nerves: Cranial nerves 2-12 are intact. No cranial nerve deficit.     Sensory: Sensation is intact. No sensory deficit.     Motor: Motor function is intact. No weakness.     Coordination: Coordination is intact. Heel to [facility] Test normal.     Comments: 5 out of 5 strength bilateral upper and lower extremities.  No facial droop, no slurred speech.  Cranial nerves II through XII intact.  Intact finger-nose, heel-to-shin.     ED  Results / Procedures / Treatments   Labs (all labs ordered are listed, but only abnormal results are displayed) Labs Reviewed  RESP PANEL BY RT-PCR (RSV, FLU A&B, COVID)  RVPGX2 - Abnormal; Notable for the following components:      Result Value   SARS Coronavirus 2 by RT PCR POSITIVE (*)    All other components within normal limits  I-STAT CREATININE, ED    EKG None  Radiology No results found.  Procedures Procedures   Medications Ordered in ED Medications  ketorolac (TORADOL) 15 MG/ML injection 15 mg (has no administration in time range)  acetaminophen (TYLENOL) tablet 1,000 mg (1,000 mg Oral Given 02/03/23 1009)    ED Course/ Medical Decision Making/ A&P  Medical Decision Making Risk OTC drugs.   52 year old male presents to ED for evaluation.  Please see HPI for further details.  On examination patient with a temperature of 100.3 Fahrenheit orally, nontachycardic.  Lung sounds clear bilaterally, not hypoxic on room air.  Abdomen soft and compressible throughout.  Neurological examination shows no focal neurodeficits.  Patient has 5 out of 5 strength to upper and lower extremities.  Patient posterior oropharynx is erythematous however no exudate.  Uvula midline, no drooling, no change phonation.  Patient provided 1 g Tylenol for headache.  Patient reports after this was administered headache persists.  15 mg Toradol IM ordered at this time.  Patient viral panel positive for COVID-19.  Patient creatinine i-STAT shows creatinine of 0.9.  Patient will be started on Paxlovid at this time.  Patient provided work note.  Patient given quarantine and isolation guidelines.  Patient provided return precautions and he voiced understanding.  Patient stable for discharge.   Final Clinical Impression(s) / ED Diagnoses Final diagnoses:  COVID-19    Rx / DC Orders ED Discharge Orders          Ordered    nirmatrelvir/ritonavir (PAXLOVID) 20 x 150 MG & 10 x 100MG TABS  2 times  daily        02/03/23 1207              Azucena Cecil, PA-C 02/03/23 1208    Sherwood Gambler, MD 02/04/23 [id number]

## 2023-02-03 NOTE — ED Triage Notes (Signed)
Pt reports feeling weak, had cough a few days ago.

## 2023-02-03 NOTE — Discharge Instructions (Addendum)
Please return to the ED with any new or worsening signs or symptoms Please see work note writing you out of work until 2/21. Please read the attached guide concerning COVID-19 as well as tips on how to quarantine and isolate yourself If you must go into public, please wear a mask Please treat symptoms at home conservatively.  Please take ibuprofen or Tylenol for headaches, fevers.  Please push fluids to include Pedialyte, Body Armor.  Please eat high-protein low fat diet. Please pick up Paxlovid prescription.  Please begin taking this as directed.

## 2024-03-11 ENCOUNTER — Encounter (HOSPITAL_COMMUNITY): Payer: Self-pay | Admitting: Emergency Medicine

## 2024-03-11 ENCOUNTER — Emergency Department (HOSPITAL_COMMUNITY)
Admission: EM | Admit: 2024-03-11 | Discharge: 2024-03-11 | Disposition: A | Discharge status: Home / Self Care | Attending: Emergency Medicine | Admitting: Emergency Medicine

## 2024-03-11 ENCOUNTER — Other Ambulatory Visit: Payer: Self-pay

## 2024-03-11 DIAGNOSIS — R42 Dizziness and giddiness: Secondary | ICD-10-CM | POA: Diagnosis present

## 2024-03-11 LAB — CBC WITH DIFFERENTIAL/PLATELET
Abs Immature Granulocytes: 0.02 10*3/uL (ref 0.00–0.07)
Basophils Absolute: 0.1 10*3/uL (ref 0.0–0.1)
Basophils Relative: 1 %
Eosinophils Absolute: 0.1 10*3/uL (ref 0.0–0.5)
Eosinophils Relative: 2 %
HCT: 39.8 % (ref 39.0–52.0)
Hemoglobin: 13.1 g/dL (ref 13.0–17.0)
Immature Granulocytes: 0 %
Lymphocytes Relative: 49 %
Lymphs Abs: 4 10*3/uL (ref 0.7–4.0)
MCH: 28.7 pg (ref 26.0–34.0)
MCHC: 32.9 g/dL (ref 30.0–36.0)
MCV: 87.3 fL (ref 80.0–100.0)
Monocytes Absolute: 0.9 10*3/uL (ref 0.1–1.0)
Monocytes Relative: 11 %
Neutro Abs: 3 10*3/uL (ref 1.7–7.7)
Neutrophils Relative %: 37 %
Platelets: 224 10*3/uL (ref 150–400)
RBC: 4.56 MIL/uL (ref 4.22–5.81)
RDW: 14 % (ref 11.5–15.5)
WBC: 8.1 10*3/uL (ref 4.0–10.5)
nRBC: 0 % (ref 0.0–0.2)

## 2024-03-11 LAB — BASIC METABOLIC PANEL
Anion gap: 8 (ref 5–15)
BUN: 12 mg/dL (ref 6–20)
CO2: 26 mmol/L (ref 22–32)
Calcium: 8.7 mg/dL — ABNORMAL LOW (ref 8.9–10.3)
Chloride: 104 mmol/L (ref 98–111)
Creatinine, Ser: 0.87 mg/dL (ref 0.61–1.24)
GFR, Estimated: >60 mL/min (ref >60)
Glucose, Bld: 88 mg/dL (ref 70–99)
Potassium: 3.7 mmol/L (ref 3.5–5.1)
Sodium: 138 mmol/L (ref 135–145)

## 2024-03-11 LAB — CBG MONITORING, ED: Glucose-Capillary: 92 mg/dL (ref 70–99)

## 2024-03-11 NOTE — Discharge Instructions (Signed)
 No certain cause of your symptoms was found tonight.  Please follow-up with your doctor for recheck.  If symptoms change or worsen, please return.

## 2024-03-11 NOTE — ED Triage Notes (Signed)
 Pt reports he has been feeling lightheaded recently and worsening today when he was at work. Reports he went to PCP last week and his BP was high. Takes no meds at home. Denies [facility] or chest pain

## 2024-03-11 NOTE — ED Provider Notes (Signed)
 [facility]-EMERGENCY DEPT [facility] Emergency Department Provider Note MRN:  [id number]  Arrival date & time: 03/11/24     Chief Complaint   Dizziness   History of Present Illness   Bryan Rivas is a 53 y.o. year-old male presents to the ED with chief complaint of lightheadedness.  Patient states that he feels dehydrated.  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  He is constantly moving and picking products.  He states that he feels lightheaded, but not dizzy.  He states that his blood pressure was high with his PCP recently.  He is not taking any blood pressure medicines.  He denies any chest pain, shortness of breath, numbness, weakness, or tingling.  Denies any other associated symptoms.  Nothing makes his symptoms better or worse.  History provided by patient.   Review of Systems  Pertinent positive and negative review of systems noted in HPI.    Physical Exam   Vitals:   03/11/24 0106 03/11/24 0200  BP: (!) 155/87 132/76  Pulse: 77 64  Resp: 17 14  Temp: 97.9 F (36.6 C)   SpO2:  100%    CONSTITUTIONAL:  non toxic-appearing, NAD NEURO:  Alert and oriented x 3, CN 3-12 grossly intact EYES:  eyes equal and reactive ENT/NECK:  Supple, no stridor  CARDIO:  normal rate, regular rhythm, appears well-perfused  PULM:  No respiratory distress, CTAB GI/GU:  non-distended,  MSK/SPINE:  No gross deformities, no edema, moves all extremities  SKIN:  no rash, atraumatic   *Additional and/or pertinent findings included in MDM below  Diagnostic and Interventional Summary    EKG Interpretation Date/Time:  Monday March 11 2024 01:34:43 EDT Ventricular Rate:  67 PR Interval:  157 QRS Duration:  84 QT Interval:  375 QTC Calculation: 396 R Axis:   71  Text Interpretation: Sinus rhythm Consider left ventricular hypertrophy ST elev, probable normal early repol pattern Confirmed by Gilda Crease [phone]) on 03/11/2024 2:24:45 AM       Labs Reviewed   BASIC METABOLIC PANEL - Abnormal; Notable for the following components:      Result Value   Calcium 8.7 (*)    All other components within normal limits  CBC WITH DIFFERENTIAL/PLATELET  CBG MONITORING, ED    No orders to display    Medications - No data to display   Procedures  /  Critical Care Procedures  ED Course and Medical Decision Making  I have reviewed the triage vital signs, the nursing notes, and pertinent available records from the EMR.  Social Determinants Affecting Complexity of Care: Patient has no clinically significant social determinants affecting this chief complaint..   ED Course:    Medical Decision Making Patient here with lightheadedness.  He states that he noticed the symptoms while he was at work.  He states that he is concerned about dehydration.  He also reports that he had elevated blood pressure and was seen by his PCP last week.  Labs and EKG are reassuring here.  No significant electrolyte derangement.  No anemia, no leukocytosis.  He is not orthostatic.  EKG is without dysrhythmias.  I have recommended that patient follow-up with his primary care doctor.  He is agreeable with this plan.  He is not having any chest pain or shortness of breath.  Doubt ACS.  Doubt PE.  Does not have any lower extremity swelling.  No recent illnesses.  No numbness, weakness, or tingling.  Amount and/or Complexity of Data  Reviewed Labs: ordered.         Consultants: No consultations were needed in caring for this patient.   Treatment and Plan: I considered admission due to patient's initial presentation, but after considering the examination and diagnostic results, patient will not require admission and can be discharged with outpatient follow-up.    Final Clinical Impressions(s) / ED Diagnoses     ICD-10-CM   1. Lightheadedness  R42       ED Discharge Orders     None         Discharge Instructions Discussed with and Provided to Patient:      Discharge Instructions      No certain cause of your symptoms was found tonight.  Please follow-up with your doctor for recheck.  If symptoms change or worsen, please return.       Roxy Horseman, PA-C 03/11/24 0250    Gilda Crease, MD 03/11/24 [phone]

## 2024-05-19 ENCOUNTER — Emergency Department (HOSPITAL_COMMUNITY)
Admission: EM | Admit: 2024-05-19 | Discharge: 2024-05-19 | Disposition: A | Discharge status: Home / Self Care | Attending: Emergency Medicine | Admitting: Emergency Medicine

## 2024-05-19 DIAGNOSIS — K4090 Unilateral inguinal hernia, without obstruction or gangrene, not specified as recurrent: Secondary | ICD-10-CM

## 2024-05-19 DIAGNOSIS — Z7982 Long term (current) use of aspirin: Secondary | ICD-10-CM | POA: Diagnosis not present

## 2024-05-19 NOTE — ED Triage Notes (Signed)
 Patient in today reporting left inguinal hernia with a little pain. Appt Friday to be seen for it.

## 2024-05-19 NOTE — Discharge Instructions (Signed)
 It was our pleasure to provide your ER care today - we hope that you feel better.  Follow up with general surgery as planned this week.  Return to ER if worse, new or worsening or severe abdominal pain, persistent vomiting, if hernia becomes persistently out and/or painful/swollen, or other concern.

## 2024-05-19 NOTE — ED Provider Notes (Signed)
 [place] EMERGENCY DEPARTMENT AT [facility] Provider Note   CSN: [id number] Arrival date & time: 05/19/24  4098     History  Chief Complaint  Patient presents with   Inguinal Hernia    Bryan Rivas is a 53 y.o. male.  Pt with hx left inguinal hernia 'for past few years', states intermittent it pouches out, and occasionally notes soreness to area. Indicates has appt with general surgery this coming week. Indicates earlier it had stuck out, left work, but now has gone back down, needs note for work. Currently no abdominal or groin pain. No nausea/vomiting. Having normal bms. No fevers.   The history is provided by the patient and medical records.       Home Medications Prior to Admission medications   Medication Sig Start Date End Date Taking? Authorizing Provider  acetaminophen  (TYLENOL ) 500 MG tablet Take 500-1,000 mg by mouth every 6 (six) hours as needed (for headaches).    [provider]  Aspirin-Salicylamide-Caffeine (BC HEADACHE POWDER PO) Take 1 packet by mouth as needed (for headaches).    [provider]  famotidine  (PEPCID ) 20 MG tablet Take 1 tablet (20 mg total) by mouth daily. Patient not taking: Reported on 06/19/2022 11/25/19   Kennedy-Smith, Colleen M, NP      Allergies    Patient has no known allergies.    Review of Systems   Review of Systems  Constitutional:  Negative for fever.  Gastrointestinal:  Negative for abdominal pain, constipation, nausea and vomiting.    Physical Exam Updated Vital Signs BP 122/72 (BP Location: Left Arm)   Pulse 67   Temp 98.2 F (36.8 C) (Oral)   Resp 15   SpO2 100%  Physical Exam Vitals and nursing note reviewed.  Constitutional:      Appearance: Normal appearance. He is well-developed.  HENT:     Head: Atraumatic.     Mouth/Throat:     Mouth: Mucous membranes are moist.  Eyes:     General: No scleral icterus. Neck:     Trachea: No tracheal deviation.  Pulmonary:     Effort:  Pulmonary effort is normal. No accessory muscle usage or respiratory distress.  Abdominal:     General: Bowel sounds are normal. There is no distension.     Palpations: Abdomen is soft. There is no mass.     Tenderness: There is no abdominal tenderness. There is no guarding.     Comments: No incarcerated hernia noted.   Genitourinary:    Comments: Normal external gu exam, no scrotal pain/selling/tenderness.  Musculoskeletal:        General: No swelling.     Comments: No groin swelling. Fem pulse 2+.   Skin:    General: Skin is warm and dry.     Findings: No rash.  Neurological:     Mental Status: He is alert.     Comments: Alert, speech clear.   Psychiatric:        Mood and Affect: Mood normal.     ED Results / Procedures / Treatments   Labs (all labs ordered are listed, but only abnormal results are displayed) Labs Reviewed - No data to display  EKG None  Radiology No results found.  Procedures Procedures    Medications Ordered in ED Medications - No data to display  ED Course/ Medical Decision Making/ A&P  Medical Decision Making Problems Addressed: Left inguinal hernia: acute illness or injury    Details: Acute/chronic  Amount and/or Complexity of Data Reviewed External Data Reviewed: notes.   Reviewed nursing notes and prior charts for additional history.   No current incarcerated hernia, area appears normal. Abd soft no tenderness, no abd pain. No hx of nv. No fevers.   Pt currently appears stable for ED d/c.   Pt to keep appointment with general surgery this week.   Return precautions provided.         Final Clinical Impression(s) / ED Diagnoses Final diagnoses:  None    Rx / DC Orders ED Discharge Orders     None         Guadalupe Lee, MD 05/19/24 1708

## 2024-10-25 ENCOUNTER — Emergency Department (HOSPITAL_COMMUNITY)
Admission: EM | Admit: 2024-10-25 | Discharge: 2024-10-25 | Disposition: A | Discharge status: Home / Self Care | Attending: Emergency Medicine | Admitting: Emergency Medicine

## 2024-10-25 DIAGNOSIS — K0889 Other specified disorders of teeth and supporting structures: Secondary | ICD-10-CM | POA: Diagnosis present

## 2024-10-25 DIAGNOSIS — R519 Headache, unspecified: Secondary | ICD-10-CM | POA: Diagnosis not present

## 2024-10-25 MED ORDER — AMOXICILLIN-POT CLAVULANATE 875-125 MG PO TABS: 1.0000 | ORAL_TABLET | Freq: Two times a day (BID) | ORAL | 0 refills | Status: AC

## 2024-10-25 MED ORDER — ACETAMINOPHEN 325 MG PO TABS
650.0000 mg | ORAL_TABLET | Freq: Once | ORAL | Status: AC
  Administered 2024-10-25: 650 mg via ORAL
  Filled 2024-10-25: qty 2

## 2024-10-25 MED ORDER — AMOXICILLIN-POT CLAVULANATE 875-125 MG PO TABS
1.0000 | ORAL_TABLET | Freq: Once | ORAL | Status: AC
  Administered 2024-10-25: 1 via ORAL
  Filled 2024-10-25: qty 1

## 2024-10-25 NOTE — Discharge Instructions (Addendum)
 As discussed, you will need close follow up with your dentist. I have provided you with a list of dentist in the area if you need a new dentist. I have personally also had good luck with [facility] here in [place] who does have Saturday appointments sometimes available.  Seek emergency care if experiencing any new or worsening symptoms.  Alternating between 650 mg Tylenol  and 400 mg Advil : The best way to alternate taking Acetaminophen  (example Tylenol ) and Ibuprofen  (example Advil /Motrin ) is to take them 3 hours apart. For example, if you take ibuprofen  at 6 am you can then take Tylenol  at 9 am. You can continue this regimen throughout the day, making sure you do not exceed the recommended maximum dose for each drug.

## 2024-10-25 NOTE — ED Provider Notes (Signed)
 [place] EMERGENCY DEPARTMENT AT [facility] Provider Note   CSN: [id number] Arrival date & time: 10/25/24  8171     Patient presents with: Headache   Bryan Rivas is a 53 y.o. male with non-contributory PMHx who presents to ED concerned for right upper molar pain x2 days. Tooth pain is causing right sided headache intermittently. Pain is relieved with BC powder. Patient denies recent fever, chest pain, SOB, nausea, vomiting, diarrhea. Patient stating that he attempted to see his dentist today, but they were not able to fit him into an appointment today.    Headache      Prior to Admission medications   Medication Sig Start Date End Date Taking? Authorizing Provider  amoxicillin -clavulanate (AUGMENTIN) 875-125 MG tablet Take 1 tablet by mouth every 12 (twelve) hours for 5 days. 10/25/24 10/30/24 Yes Tariya Morrissette, Nidia F, PA-C  acetaminophen  (TYLENOL ) 500 MG tablet Take 500-1,000 mg by mouth every 6 (six) hours as needed (for headaches).    [provider]  Aspirin-Salicylamide-Caffeine (BC HEADACHE POWDER PO) Take 1 packet by mouth as needed (for headaches).    [provider]  famotidine  (PEPCID ) 20 MG tablet Take 1 tablet (20 mg total) by mouth daily. Patient not taking: Reported on 06/19/2022 11/25/19   Kennedy-Smith, Colleen M, NP    Allergies: Patient has no known allergies.    Review of Systems  Neurological:  Positive for headaches.    Updated Vital Signs BP (!) 159/82 (BP Location: Right Arm)   Pulse 61   Temp 98.4 F (36.9 C) (Oral)   Resp 18   SpO2 100%   Physical Exam Vitals and nursing note reviewed.  Constitutional:      General: He is not in acute distress.    Appearance: He is not ill-appearing or toxic-appearing.  HENT:     Head: Normocephalic and atraumatic.     Mouth/Throat:     Mouth: Mucous membranes are moist.     Pharynx: No oropharyngeal exudate or posterior oropharyngeal erythema.     Comments: No swelling,  erythema or oropharyngeal lesions. Right molar with dental caries present.  Eyes:     General:        Right eye: No discharge.        Left eye: No discharge.     Conjunctiva/sclera: Conjunctivae normal.  Cardiovascular:     Rate and Rhythm: Normal rate.     Pulses: Normal pulses.  Pulmonary:     Effort: Pulmonary effort is normal.  Musculoskeletal:     Right lower leg: No edema.     Left lower leg: No edema.  Skin:    General: Skin is warm and dry.     Findings: No rash.  Neurological:     General: No focal deficit present.     Mental Status: He is alert. Mental status is at baseline.     Comments: GCS 15. Speech is goal oriented. No deficits appreciated to CN III-XII; symmetric eyebrow raise, no facial drooping, tongue midline. Patient has equal grip strength bilaterally with 5/5 strength against resistance in all major muscle groups bilaterally. Sensation to light touch intact. Patient moves extremities without ataxia.  Psychiatric:        Mood and Affect: Mood normal.        Behavior: Behavior normal.     (all labs ordered are listed, but only abnormal results are displayed) Labs Reviewed - No data to display  EKG: None  Radiology: No results found.  Procedures   Medications Ordered in the ED  amoxicillin -clavulanate (AUGMENTIN) 875-125 MG per tablet 1 tablet (has no administration in time range)  acetaminophen  (TYLENOL ) tablet 650 mg (has no administration in time range)                                    Medical Decision Making   This patient presents to the ED for concern of dental pain, this involves an extensive number of treatment options, and is a complaint that carries with it a high risk of complications and morbidity.  The differential diagnosis includes deep space infection   Co morbidities that complicate the patient evaluation  none   Additional history obtained:  PCP with [facility] at [facility]   Problem List / ED  Course / Critical interventions / Medication management  Patient presents to ED concerned for dental pain. Pain intermittently associated with right sided headache.  Physical exam reassuring. Patient afebrile with stable vitals.  Provided patient with a dose of Augmentin in ED and with discharge with a 5 day course of Augmentin. Provided patient with list for dentist in the area. Educated patient that symptoms will return and may become more complicated if dentist follow up is not obtained. Patient verbally endorsed understanding of plan. I have reviewed the patients home medicines and have made adjustments as needed The patient has been appropriately medically screened and/or stabilized in the ED. I have low suspicion for any other emergent medical condition which would require further screening, evaluation or treatment in the ED or require inpatient management. At time of discharge the patient is hemodynamically stable and in no acute distress. I have discussed work-up results and diagnosis with patient and answered all questions. Patient is agreeable with discharge plan. We discussed strict return precautions for returning to the emergency department and they verbalized understanding.     Social Determinants of Health:  none       Final diagnoses:  Pain, dental    ED Discharge Orders          Ordered    amoxicillin -clavulanate (AUGMENTIN) 875-125 MG tablet  Every 12 hours        10/25/24 1951               Hoy Nidia FALCON, [place] 10/25/24 1956    Cottie Donnice PARAS, MD 10/25/24 2115

## 2024-10-25 NOTE — ED Triage Notes (Signed)
 C/o intermittent HA for two days. States his upper right side teeth will start to hurt first, then he head will start hurting. Has receding gums.

## 2024-11-23 ENCOUNTER — Emergency Department (HOSPITAL_COMMUNITY)
Admission: EM | Admit: 2024-11-23 | Discharge: 2024-11-23 | Disposition: A | Discharge status: Home / Self Care | Attending: Emergency Medicine | Admitting: Emergency Medicine

## 2024-11-23 ENCOUNTER — Other Ambulatory Visit: Payer: Self-pay

## 2024-11-23 DIAGNOSIS — K0889 Other specified disorders of teeth and supporting structures: Secondary | ICD-10-CM | POA: Insufficient documentation

## 2024-11-23 MED ORDER — PENICILLIN V POTASSIUM 500 MG PO TABS: 500.0000 mg | ORAL_TABLET | Freq: Four times a day (QID) | ORAL | 0 refills | Status: AC

## 2024-11-23 MED ORDER — IBUPROFEN 600 MG PO TABS: 600.0000 mg | ORAL_TABLET | Freq: Four times a day (QID) | ORAL | 0 refills | Status: DC | PRN

## 2024-11-23 MED ORDER — PENICILLIN V POTASSIUM 500 MG PO TABS: 500.0000 mg | ORAL_TABLET | Freq: Four times a day (QID) | ORAL | 0 refills | Status: DC

## 2024-11-23 MED ORDER — IBUPROFEN 600 MG PO TABS: 600.0000 mg | ORAL_TABLET | Freq: Four times a day (QID) | ORAL | 0 refills | Status: AC | PRN

## 2024-11-23 NOTE — ED Provider Notes (Signed)
 [place] EMERGENCY DEPARTMENT AT [facility] Provider Note   CSN: [id number] Arrival date & time: 11/23/24  1714     Patient presents with: Dental Pain   Bryan Rivas is a 53 y.o. male.   The history is provided by the patient and medical records. No language interpreter was used.  Dental Pain    53 year old male with history of anxiety presenting with complaint of dental pain.  Patient reports recurrent pain to his right upper tooth ongoing for nearly a month.  Pain is described as a throbbing sensation worse with chewing and with temperature changes.  Pain is not adequately relieved with over-the-counter medication.  He does have an appointment with the dentist in 4 days but due to worsening pain he decided to come to the ER for further management.  He does not endorse any fever no trouble swallowing no throat swelling no neck pain he denies any trauma.  Prior to Admission medications   Medication Sig Start Date End Date Taking? Authorizing Provider  acetaminophen  (TYLENOL ) 500 MG tablet Take 500-1,000 mg by mouth every 6 (six) hours as needed (for headaches).    [provider]  Aspirin-Salicylamide-Caffeine (BC HEADACHE POWDER PO) Take 1 packet by mouth as needed (for headaches).    [provider]  famotidine  (PEPCID ) 20 MG tablet Take 1 tablet (20 mg total) by mouth daily. Patient not taking: Reported on 06/19/2022 11/25/19   Kennedy-Smith, Colleen M, NP    Allergies: Patient has no known allergies.    Review of Systems  HENT:  Positive for dental problem.     Updated Vital Signs BP 128/78 (BP Location: Right Arm)   Pulse 67   Temp 98 F (36.7 C) (Oral)   Resp 16   SpO2 98%   Physical Exam Constitutional:      General: He is not in acute distress.    Appearance: He is well-developed.  HENT:     Head: Atraumatic.     Mouth/Throat:     Comments: Tenderness to tooth #5 without significant dental decay and no adjacent abscess or  facial involvement noted. Eyes:     Conjunctiva/sclera: Conjunctivae normal.  Musculoskeletal:     Cervical back: Normal range of motion and neck supple. No rigidity or tenderness.  Lymphadenopathy:     Cervical: No cervical adenopathy.  Skin:    Findings: No rash.  Neurological:     Mental Status: He is alert.     (all labs ordered are listed, but only abnormal results are displayed) Labs Reviewed - No data to display  EKG: None  Radiology: No results found.   Procedures   Medications Ordered in the ED - No data to display                                  Medical Decision Making  BP 128/78 (BP Location: Right Arm)   Pulse 67   Temp 98 F (36.7 C) (Oral)   Resp 16   SpO2 98%   50:21 PM 53 year old male with history of anxiety presenting with complaint of dental pain.  Patient reports recurrent pain to his right upper tooth ongoing for nearly a month.  Pain is described as a throbbing sensation worse with chewing and with temperature changes.  Pain is not adequately relieved with over-the-counter medication.  He does have an appointment with the dentist in 4 days but due to worsening  pain he decided to come to the ER for further management.  He does not endorse any fever no trouble swallowing no throat swelling no neck pain he denies any trauma.  Tenderness to tooth #5 without obvious dental decay dental abscess with facial involvement.  Will treat with anti-inflammatory patient and antibiotic.  Patient will follow-up with his dentist as previously scheduled.  No advanced imaging indicated at this time.  Suspect pulpitis.     Final diagnoses:  Pain, dental    ED Discharge Orders          Ordered    ibuprofen  (ADVIL ) 600 MG tablet  Every 6 hours PRN        11/23/24 1744    penicillin  v potassium (VEETID) 500 MG tablet  4 times daily        11/23/24 1744               Nivia Colon, PA-C 11/23/24 1745    Armenta Canning, MD 11/23/24 2019

## 2024-11-23 NOTE — Discharge Instructions (Signed)
 Take medication as prescribed for your dental pain which is likely due to an infection.  Follow-up with your dentist for further care.

## 2024-11-23 NOTE — ED Triage Notes (Signed)
 Pt has c/o right side dental pain. Pt was seen about 3 weeks ago for the same and took a round of abx that helped, but now pain is back and pt dentist appt. is not for one more week.
